# Patient Record
Sex: Female | Born: 1950 | Race: White | Hispanic: No | Marital: Married | Smoking: Never smoker
Health system: Southern US, Community
[De-identification: ages and names within clinical notes are randomized; demographics above are authoritative.]

## PROBLEM LIST (undated history)

## (undated) DIAGNOSIS — Z8041 Family history of malignant neoplasm of ovary: Secondary | ICD-10-CM

## (undated) DIAGNOSIS — G4700 Insomnia, unspecified: Secondary | ICD-10-CM

## (undated) DIAGNOSIS — U071 COVID-19: Secondary | ICD-10-CM

## (undated) DIAGNOSIS — I10 Essential (primary) hypertension: Secondary | ICD-10-CM

## (undated) DIAGNOSIS — C73 Malignant neoplasm of thyroid gland: Secondary | ICD-10-CM

## (undated) DIAGNOSIS — K635 Polyp of colon: Secondary | ICD-10-CM

## (undated) DIAGNOSIS — Q859 Phakomatosis, unspecified: Secondary | ICD-10-CM

## (undated) DIAGNOSIS — I82409 Acute embolism and thrombosis of unspecified deep veins of unspecified lower extremity: Secondary | ICD-10-CM

## (undated) DIAGNOSIS — Z800 Family history of malignant neoplasm of digestive organs: Secondary | ICD-10-CM

## (undated) DIAGNOSIS — D219 Benign neoplasm of connective and other soft tissue, unspecified: Secondary | ICD-10-CM

## (undated) HISTORY — DX: Benign neoplasm of connective and other soft tissue, unspecified: D21.9

## (undated) HISTORY — DX: COVID-19: U07.1

## (undated) HISTORY — DX: Essential (primary) hypertension: I10

## (undated) HISTORY — PX: OTHER SURGICAL HISTORY: SHX169

## (undated) HISTORY — DX: Polyp of colon: K63.5

## (undated) HISTORY — DX: Malignant neoplasm of thyroid gland: C73

## (undated) HISTORY — PX: POLYPECTOMY: SHX149

## (undated) HISTORY — DX: Insomnia, unspecified: G47.00

## (undated) HISTORY — DX: Family history of malignant neoplasm of ovary: Z80.41

## (undated) HISTORY — DX: Family history of malignant neoplasm of digestive organs: Z80.0

## (undated) HISTORY — DX: Phakomatosis, unspecified: Q85.9

## (undated) HISTORY — DX: Acute embolism and thrombosis of unspecified deep veins of unspecified lower extremity: I82.409

---

## 1998-09-14 ENCOUNTER — Other Ambulatory Visit: Admission: RE | Admit: 1998-09-14 | Discharge: 1998-09-14 | Payer: Self-pay | Admitting: *Deleted

## 1999-09-24 ENCOUNTER — Other Ambulatory Visit: Admission: RE | Admit: 1999-09-24 | Discharge: 1999-09-24 | Payer: Self-pay | Admitting: *Deleted

## 2000-10-18 ENCOUNTER — Other Ambulatory Visit: Admission: RE | Admit: 2000-10-18 | Discharge: 2000-10-18 | Payer: Self-pay | Admitting: *Deleted

## 2001-12-19 DIAGNOSIS — I82409 Acute embolism and thrombosis of unspecified deep veins of unspecified lower extremity: Secondary | ICD-10-CM

## 2001-12-19 DIAGNOSIS — C73 Malignant neoplasm of thyroid gland: Secondary | ICD-10-CM

## 2001-12-19 HISTORY — DX: Malignant neoplasm of thyroid gland: C73

## 2001-12-19 HISTORY — PX: THYROIDECTOMY: SHX17

## 2001-12-19 HISTORY — DX: Acute embolism and thrombosis of unspecified deep veins of unspecified lower extremity: I82.409

## 2002-01-07 ENCOUNTER — Other Ambulatory Visit: Admission: RE | Admit: 2002-01-07 | Discharge: 2002-01-07 | Payer: Self-pay | Admitting: *Deleted

## 2002-01-11 ENCOUNTER — Ambulatory Visit (HOSPITAL_COMMUNITY): Admission: RE | Admit: 2002-01-11 | Discharge: 2002-01-11 | Payer: Self-pay | Admitting: Endocrinology

## 2002-01-11 ENCOUNTER — Encounter: Payer: Self-pay | Admitting: Endocrinology

## 2002-02-05 ENCOUNTER — Other Ambulatory Visit: Admission: RE | Admit: 2002-02-05 | Discharge: 2002-02-05 | Payer: Self-pay | Admitting: Endocrinology

## 2002-03-29 ENCOUNTER — Encounter: Payer: Self-pay | Admitting: Surgery

## 2002-04-08 ENCOUNTER — Encounter (INDEPENDENT_AMBULATORY_CARE_PROVIDER_SITE_OTHER): Payer: Self-pay

## 2002-04-08 ENCOUNTER — Inpatient Hospital Stay (HOSPITAL_COMMUNITY): Admission: RE | Admit: 2002-04-08 | Discharge: 2002-04-09 | Payer: Self-pay | Admitting: Surgery

## 2002-04-11 ENCOUNTER — Inpatient Hospital Stay (HOSPITAL_COMMUNITY): Admission: AD | Admit: 2002-04-11 | Discharge: 2002-04-12 | Payer: Self-pay | Admitting: Surgery

## 2002-04-15 ENCOUNTER — Ambulatory Visit (HOSPITAL_COMMUNITY): Admission: RE | Admit: 2002-04-15 | Discharge: 2002-04-15 | Payer: Self-pay

## 2002-07-01 ENCOUNTER — Encounter: Payer: Self-pay | Admitting: Endocrinology

## 2002-07-01 ENCOUNTER — Ambulatory Visit (HOSPITAL_COMMUNITY): Admission: RE | Admit: 2002-07-01 | Discharge: 2002-07-01 | Payer: Self-pay | Admitting: Endocrinology

## 2002-07-05 ENCOUNTER — Ambulatory Visit (HOSPITAL_COMMUNITY): Admission: RE | Admit: 2002-07-05 | Discharge: 2002-07-05 | Payer: Self-pay | Admitting: Endocrinology

## 2002-08-13 ENCOUNTER — Ambulatory Visit (HOSPITAL_COMMUNITY): Admission: RE | Admit: 2002-08-13 | Discharge: 2002-08-13 | Payer: Self-pay | Admitting: Endocrinology

## 2002-08-13 ENCOUNTER — Encounter: Payer: Self-pay | Admitting: Endocrinology

## 2003-01-10 ENCOUNTER — Other Ambulatory Visit: Admission: RE | Admit: 2003-01-10 | Discharge: 2003-01-10 | Payer: Self-pay | Admitting: *Deleted

## 2004-02-12 ENCOUNTER — Other Ambulatory Visit: Admission: RE | Admit: 2004-02-12 | Discharge: 2004-02-12 | Payer: Self-pay | Admitting: *Deleted

## 2005-02-14 ENCOUNTER — Other Ambulatory Visit: Admission: RE | Admit: 2005-02-14 | Discharge: 2005-02-14 | Payer: Self-pay | Admitting: *Deleted

## 2005-04-29 ENCOUNTER — Encounter (INDEPENDENT_AMBULATORY_CARE_PROVIDER_SITE_OTHER): Payer: Self-pay | Admitting: Specialist

## 2005-04-29 ENCOUNTER — Ambulatory Visit (HOSPITAL_COMMUNITY): Admission: RE | Admit: 2005-04-29 | Discharge: 2005-04-29 | Payer: Self-pay | Admitting: Obstetrics and Gynecology

## 2005-04-29 ENCOUNTER — Ambulatory Visit (HOSPITAL_BASED_OUTPATIENT_CLINIC_OR_DEPARTMENT_OTHER): Admission: RE | Admit: 2005-04-29 | Discharge: 2005-04-29 | Payer: Self-pay | Admitting: Obstetrics and Gynecology

## 2006-02-21 ENCOUNTER — Other Ambulatory Visit: Admission: RE | Admit: 2006-02-21 | Discharge: 2006-02-21 | Payer: Self-pay | Admitting: Obstetrics & Gynecology

## 2006-03-23 ENCOUNTER — Ambulatory Visit: Payer: Self-pay | Admitting: Internal Medicine

## 2006-05-11 ENCOUNTER — Encounter (INDEPENDENT_AMBULATORY_CARE_PROVIDER_SITE_OTHER): Payer: Self-pay | Admitting: Specialist

## 2006-05-11 ENCOUNTER — Ambulatory Visit: Payer: Self-pay | Admitting: Internal Medicine

## 2007-05-11 ENCOUNTER — Other Ambulatory Visit: Admission: RE | Admit: 2007-05-11 | Discharge: 2007-05-11 | Payer: Self-pay | Admitting: Obstetrics & Gynecology

## 2007-07-23 ENCOUNTER — Encounter: Payer: Self-pay | Admitting: Obstetrics & Gynecology

## 2007-07-23 ENCOUNTER — Inpatient Hospital Stay (HOSPITAL_COMMUNITY): Admission: RE | Admit: 2007-07-23 | Discharge: 2007-07-25 | Payer: Self-pay | Admitting: *Deleted

## 2007-09-06 ENCOUNTER — Ambulatory Visit: Payer: Self-pay | Admitting: Internal Medicine

## 2007-10-11 ENCOUNTER — Ambulatory Visit: Payer: Self-pay | Admitting: Internal Medicine

## 2007-12-20 HISTORY — PX: ABDOMINAL HYSTERECTOMY: SHX81

## 2008-01-09 DIAGNOSIS — I2699 Other pulmonary embolism without acute cor pulmonale: Secondary | ICD-10-CM | POA: Insufficient documentation

## 2008-01-09 DIAGNOSIS — I82409 Acute embolism and thrombosis of unspecified deep veins of unspecified lower extremity: Secondary | ICD-10-CM | POA: Insufficient documentation

## 2008-01-09 DIAGNOSIS — C73 Malignant neoplasm of thyroid gland: Secondary | ICD-10-CM | POA: Insufficient documentation

## 2008-01-10 ENCOUNTER — Ambulatory Visit: Payer: Self-pay | Admitting: Internal Medicine

## 2008-01-10 DIAGNOSIS — R599 Enlarged lymph nodes, unspecified: Secondary | ICD-10-CM | POA: Insufficient documentation

## 2009-05-21 ENCOUNTER — Ambulatory Visit: Payer: Self-pay | Admitting: Internal Medicine

## 2011-05-03 NOTE — Discharge Summary (Signed)
April Floyd, PASION NO.:  [id number]   MEDICAL RECORD NO.:  [id number]          PATIENT TYPE:  INP   LOCATION:  9317                          FACILITY:  [facility]   PHYSICIAN:  M. Leda Quail, MD  DATE OF BIRTH:  1951/12/06   DATE OF ADMISSION:  07/23/2007  DATE OF DISCHARGE:  07/25/2007                               DISCHARGE SUMMARY   ADMISSION DIAGNOSES:  1. Patient is a 60 year old G2 P2 married white female with fibroid      uterus.  2. Menorrhagia.  3. History of anemia.  4. History of thyroid cancer.  5. History of deep vein thrombosis.   POSTOPERATIVE DIAGNOSES:  1. Patient is a 60 year old G2 P2 married white female with fibroid      uterus.  2. Menorrhagia.  3. History of anemia.  4. History of thyroid cancer.  5. History of deep vein thrombosis.  6. Endometriosis.   PROCEDURE:  Total abdominal hysterectomy.   HISTORY OF PRESENT ILLNESS:  In brief, Ms. Stansbery is a very nice 55-year-  old G2 P2 married white female who has a history of fibroid uterus with  symptomatic menorrhagia.  She has had anemia, but we have been able to  improve that with progesterone therapy.  She has unfortunately continued  to have heavy bleeding, and she does have a 14-16 week sized uterus on  physical exam.  She has decided to definitive therapy.  Because of her  history of DVT, I have discussed treatment with Lovenox for prevention  of this.   PAST MEDICAL HISTORY:  Thyroid cancer, history of DVT, breast hematoma,  colonic polyps.   MEDICATIONS:  Synthroid, baby aspirin, norethindrone.   FAMILY HISTORY:  Sister with ovarian cancer.   PHYSICAL EXAM:  Temperature 93.9, pulse 60, respirations 18, BP 99/68.  The only pertinent positives is that she does have a thyroidectomy scar,  and on GYN exam, she has an enlarged 14-16 week sized uterus.   HOSPITAL COURSE:  The patient was admitted to same-day surgery.  She was  taken to the operating room where a TAH was  performed.  She did have a  grossly enlarged uterus that completely filled the pelvis with multiple  fibroids.  The patient did well with surgery.  She had a approximately  150 cc of blood loss and about 150 cc urine output.  After her surgery,  she was taken to the recovery room.  After an appropriate time in the  recovery room, she was taken to the third floor for the remainder of her  hospitalization.   In the evening of postoperative day #1, she was seen.  She was doing  well.  She was having some mild nausea but did have good pain control.  Vital signs were stable, and she had made good urine output.  The  abdomen had rare bowel sounds but was appropriately tender and  nondistended.   On postoperative day #1, she was feeling okay.  She felt like her PCA  was given her nausea.  She was afebrile with stable vital signs.  Again  she had made good urine output.  Physical exam was benign.  She had  excellent bowel sounds.  Postop hemoglobin was 10.9.  White count was  7.2.  Electrolytes were normal.  BUN and creatinine were 6 and 0.7.  Her  Foley was discontinued.  She was advanced to a regular diet.  She was  encouraged to ambulate.  She was switched to oral pain medicines.  The  patient did have some issues with pain management this day; was  ultimately controlled with Percocet.   However, in the evening of postoperative day #1, she began to have some  right-sided shoulder pain.  This worsened in the evening, and at about  10:30, I was called by on-floor nursing.  At this point, she was quite  anxious with it, and she was having some pain with deep inspiration.  An  EKG, stat CBC, troponin-1 and BNP were obtained.  Her hemoglobin was  11.9.  A troponin was normal.  BMP was 125.  She was seen and evaluated.  Although her vital signs were stable, with her history, it decided to go  ahead and obtain a chest CT.  This did not show any evidence of  pulmonary emboli or dissecting aortic  aneurysm, but there was some mild  mediastinal adenopathy and some question about finding of sarcoidosis.  This was relayed to the patient but can be followed post-operatively.  After the CT was negative, she was treated again with pain medication  which actually improved her pain and completely resolved the shoulder  pain.  Her incentive spirometer was encouraged; regular pain medication  was encouraged.   On the morning of postoperative day #2, she slept well, she was feeling  better, she had passed flatus, she was afebrile with stable vital signs.  Again pulse ox was 98-100% on room air.  She had a completely benign  exam.  Because of the events of the evening, I have decided to watch her  through the day.   She was seen in the evening of postoperative day #2.  She had done well  during the day.  The shoulder pain was essentially gone.  She was having  no breathing issues.  At this point, her On-Q pump tubing was removed,  and discharge instruments were provided in written and verbal form for  the patient.  I did feel discharge to home was appropriate, and she will  be discharged with her husband and friend.   DISCHARGE MEDICATIONS:  She will be discharged with:  1. Percocet 05/325 mg 1-2 tablets p.o. q.4-6 hours p.r.n. pain.  2. Motrin 400-800 mg every 8 hours as needed for pain.  3. Lovenox 40 mg subcu daily, and she will continue this for 4 weeks.  4. She is also to start Colace 100 mg b.i.d. for constipation      prevention.   PATHOLOGY REPORT:  Shows a 15 x 13 x 10 cm uterus with a 4-cm cervix  that weighs 176 grams and contained endometrial polyp, weakly  proliferative endometrium, submucous and intramural leiomyomas.      Lum Keas, MD  Electronically Signed     MSM/MEDQ  D:  07/25/2007  T:  07/26/2007  Job:  [id number]

## 2011-05-03 NOTE — Op Note (Signed)
April Floyd, April [name]:  [id number]   MEDICAL RECORD NO.:  [id number]          PATIENT TYPE:  AMB   LOCATION:  [facility]C                           FACILITY:  [facility]   PHYSICIAN:  M. Leda Quail, MD  DATE OF BIRTH:  [birth year]   DATE OF PROCEDURE:  DATE OF DISCHARGE:                               OPERATIVE REPORT   PREOPERATIVE DIAGNOSES:  64. A 60 year old, G2, P2, married ,white female with a fibroid uterus.  2. Menorrhagia.  3. History of anemia.  4. History of thyroid cancer.  5. History of the DVT.   POSTOPERATIVE DIAGNOSES:  69. A 60 year old, G2, P2, married ,white female with a fibroid uterus.  2. Menorrhagia.  3. History of anemia.  4. History of thyroid cancer.  5. History of the DVT.  6. Endometriosis.   PROCEDURE:  TAH.   SURGEON:  Dr. Leda Quail.   ASSISTANT:  Dr. Edwena Felty. Romine.   ANESTHESIA:  General endotracheal.   FINDINGS:  Large 14-16-week uterus with some endometriosis in the  posterior cul-de-sac.   SPECIMENS:  Uterus and cervix sent to pathology.   ESTIMATED BLOOD LOSS:  125.   URINE OUTPUT:  150 mL of clear urine output.   FLUIDS:  1800 mL of LR.   COMPLICATIONS:  None.   INDICATIONS:  April Floyd is a very nice 60 year old, G2, P2, married,  white female who has an enlarged uterus I had been following  conservatively.  This year when I saw her for exam, she had a hemoglobin  of 10.  She has been treated with Micronor but was failing Micronor  therapy.  She was put on higher dose continuous progestin therapy to  improve her bleeding.  The quantity of flow did improve and her  hemoglobin is ultimately improved but she has continued to have  irregular bleeding.  She also has a palpable uterus on physical exam  just with palpation of the abdomen due to the size of this uterus.  She  is in very good shape and has begun to have significant more amount of  pelvic pressure and bladder pressure.  She has decided at this point  to  proceed with definitive therapy with surgical intervention.  I did talk  to Dr. Stanford Breed at [facility] due to her history of DVT and we  decided to treat her not only with sequential compression devices but  subcutaneous Lovenox for 4 weeks postop.   PROCEDURE:  The patient was taken the to operating room.  She was placed  in supine position.  General endotracheal anesthesia was administered by  the anesthesia staff without difficulty. The legs were positioned in the  frog-leg position.  Abdomen, perineum, inner thighs and vagina were  prepped in normal sterile fashion, a Foley catheter was inserted in the  bladder under sterile conditions.  The legs were then straightened with  support under the knees and the patient is draped in the normal sterile  fashion.  The Pfannenstiel skin incision was made through her old C-  section incision.  The subcutaneous fat tissue was  dissected sharply.  Cautery is obtained with the Bovie as necessary.  The fascia was  identified and nicked in the midline, the fascial incision is extended  laterally using curved Mayo scissors.   Kocher clamps were applied to the inferior aspect of the fascial  incision.  The underlying rectus muscles were dissected off sharply.  In  a similar fashion, Kocher clamps were applied to the inferior aspect of  the fascial incision and the underlying rectus muscle was dissected off  sharply.  The rectus muscle was divided in the midline.  The peritoneum  was identified, elevated, and entered sharply. The peritoneal incision  was extended superiorly and then inferiorly with care taken to note the  location of the bladder.   The abdomen surveyed, the liver edge was smooth.  The gallbladder was  decompressed, the aorta was palpable. There is no pelvic mass or  lymphadenopathy. The diaphragm is smooth and the kidneys are palpable. A  Balfour retractor was then placed intraabdominally.  Care was taken to  ensure no  bowel is in the way.  Because the patient is very thin, green  towels were placed underneath the retractor to lift it up off the psoas  muscles to ensure no nerve injury occurred.  The bowel was packed with  three moistened laparotomy sponges superiorly.  A bladder blade was  inserted inferiorly.   Long Kelly clamps were placed along the uterine ovarian ligament  bilaterally.  Attention is turned initially to the left side. The ureter  was identified.  The round ligament suture ligated.  The posterior leaf  of the broad ligament was opened parallel to the IP ligament and then  the inferior leaf of the broad ligament is opened.  A peritoneal  incision is brought to the level of the internal os at the midline.  The  utero-ovarian pedicle on the left side is isolated and clamped with a  Heaney clamp.  It is transected and suture ligated first with a free tie  of #0 Vicryl and then with a stitch tie.   In a similar fashion, the right round ligament is identified and suture  ligated.  The posterior leaf of the broad ligament is opened parallel to  the IP ligament.  The utero-ovarian pedicle on the right side is  isolated and clamped with a Heaney clamp.  The pedicle is transected and  suture ligated with a free tie of #0 Vicryl and the second with a stitch  tie.  The peritoneum is then incised from the inferior leaf of the broad  ligament down again to the level of the internal os of the cervix and  connected to the previous incision done on the left side.  Then the  beginning of the creation of the bladder flap is begun by dissecting the  filmy tissue.  There was some scar where the 2 previous C sections were  and this was taken down sharply.   At this point, the uterine arteries were skeletonized bilaterally and  clamped with curved Heaney clamps.  Back clamping is performed.  The  pedicles were transected and suture ligated with two #0 Vicryl stitch  ties.  This was done bilaterally.   Further dissection of the bladder  flap was performed as necessary down the cervix.  The glistening white  tissue of the cervix was well visualized to ensure that we were in the  right plane.  At the beginning of the cardinal ligament, transection was  performed by  clamping close to the cervix with straight Heaney clamps.  These pedicles were then transected and suture ligated with #0 Vicryl.  At this point, the fundus of the uterus was amputated with a knife.  Kocher clamps were placed across the top of the cervix.  The remainder  of the cardinal ligaments are then serially clamped, transected and  suture ligated with #0 Vicryl.  Further dissection of the bladder was  performed as necessary.  There is a small amount of endometriosis in the  posterior cul-de-sac and tissue of the rectum is close to the base of  the cervix.  The peritoneum at this point is opened at the base of the  cervix and the tissue is pushed down to ensure that the rectum is not in  the surgical field.  At this point curved Heaney clamps were placed  along the base of the cervix.  The pedicles were transected and Heaney  suture with #0 Vicryl.  Jorgenson scissors were then used to amputate  the cervix off of the vaginal mucosa by cutting very close to the edge  of the cervix and leaving as much vaginal mucosa as possible.  At this  point, the cervix was handed off and photo documentation of the cervix  and uterus were made.  The vagina was then closed with figure-of-eight  sutures of #0 Vicryl. Before the vagina is closed, the angle of the  vagina is attached to ipsilateral uterosacral ligament to ensure that  the vagina is well supported.  The vagina is closed with figure-of-eight  sutures of #0 Vicryl.   The pelvis was irrigated with copious amounts of warm normal saline.  There was a small amount of bleeding at the vaginal cuff which is made  hemostatic with an additional figure-of-eight suture of #0 Vicryl.   Then  the area where the peritoneal dissection was performed also had a little  bit of bleeding which was made hemostatic with a figure-of-eight suture  of 2-0 Vicryl.  A small amount of bleeding along the bladder flap which  was made hemostatic with cautery. Gelfoam was placed in the bladder flap  to ensure there is excellent hemostasis.  The pelvis is completely  surveyed.  No additional bleeding is noted.  The round ligaments were  tied to the ipsilateral ovary to keep the ovary out of the pelvis.  The  ureters were identified bilaterally with great peristalsis.   At this point, the procedure was ended, the Balfour retractor was  removed from the abdomen and the bladder blade was removed and three-  point laparotomy sponges are removed.  The bowel was placed back in  normal anatomic position and the omentum is placed in normal anatomic  position.  The peritoneum was closed with a running #0 Vicryl stitch.  The subfascial tissue and rectus muscles were visualized. There was a  small amount of bleeding which was made hemostatic with cautery.  An On-  Q pump tubing was placed subfascially on the right side using the  introducer.  The tubing is attached to the skin with Steri-Strips and a  Tegaderm.  The fascia is then closed starting at the corners running to  the midline with a #0 Vicryl.  The subcutaneous fat tissue was irrigate,  no bleeding was noted.  A second On-Q pump tubing is placed on the  patient's left side with the introducer subcutaneously.  Again the  tubing is attached to the skin with Steri-Strips and Tegaderm.  The skin  is then closed with a subcuticular stitch of 3-0 Vicryl.  Benzoin and  Steri-Strips were applied.  5 mL of 1% Xylocaine are placed  subcutaneously and 10 mL were placed subfascially.  At this point, the  procedure is ended, sponge, lap, needle, and instrument counts are  correct x2.  The patient tolerated the procedure well.  She is awakened  from  anesthesia  and extubated without difficulty.  She is taken to the  recovery room in stable condition.      Lum Keas, MD  Electronically Signed     MSM/MEDQ  D:  07/23/2007  T:  07/23/2007  Job:  [id number]

## 2011-05-03 NOTE — Assessment & Plan Note (Signed)
[facility]                             PULMONARY OFFICE NOTE   April Floyd, April Floyd                        MRN:          [id number]  DATE:09/06/2007                            DOB:          01/23/1951    PROBLEM:  This is a 60 year old woman referred in pulmonary consultation  at the kind request of Dr. Hyacinth Meeker because of an abnormal CT scan.   HISTORY:  She had a hysterectomy and had postoperative shoulder pains  leading to a CT scan of the chest. This showed a tiny effusion and  atelectasis with mild mediastinal and hilar adenopathy, and some  interstitial prominence. Sarcoid was questioned. She has had no history  of exposure to birds or to sustained time in parts of the [place] where  granulomatous infections are common. Her shoulder discomfort has  resolved. She has not experienced exertional dyspnea, cough, wheeze,  night sweats, rash, arthralgias, or adenopathy, and she has no history  of liver disease.   MEDICATIONS:  1. Synthroid 135 mcg.  2. Multivitamins.  3. Aspirin 81 mg.   ALLERGIES:  No medication allergy.   REVIEW OF SYSTEMS:  Headaches, but otherwise essentially negative except  as per HPI.   PAST HISTORY:  Remote deep vein thrombosis at the time of surgery with  pulmonary embolism. She took Lovenox, then Coumadin, and was told that  she did not have a coagulopathy. She had thyroidectomy for thyroid  cancer treated with radioactive iodine. An episode of transient global  amnesia with multiple medical evaluations at that time including a CT  scan of the chest. She was not told of any problem. Surgeries for  thyroidectomy and hysterectomy. No exposure to tuberculosis.   SOCIAL HISTORY:  Never smoked, rare social alcohol. She is married with  two children. She works as an Armed forces training and education officer.   FAMILY HISTORY:  Sister with ovarian cancer, grandfather with brain  cancer.   OBJECTIVE:  VITAL  SIGNS:  Weight 135 pounds, blood pressure 110/68,  pulse 57, room air saturation 100%.  GENERAL:  Tall, trim, wiry, fit appearing woman. She seems comfortable.  No adenopathy found.  HEENT:  Nose and throat clear.  NECK:  No neck vein distension, thyroidectomy scar.  CHEST:  Clear. Breathing unlabored.  HEART:  Sounds are regular without murmur or gallop.  ABDOMEN:  No enlargement of the liver or spleen.  EXTREMITIES:  Without cyanosis, clubbing, or edema.   Lab work includes an ACE level normal at 17 (9 to 67), sedimentation  rate was normal at 8. These were drawn on 08/09/2007.   IMPRESSION:  Non-specific changes may reflect old granulomatous disease  or burned out sarcoid. I doubt that therapy will be required.   PLAN:  We are scheduling pulmonary function tests and she will return  then in four months for follow up. We will likely get a chest x-ray on  that return and repeat an ACE level looking for evidence of activity. I  appreciate the chance to meet her.     Clinton D. Maple Hudson, MD,  FCCP, FACP  Electronically Signed    CDY/MedQ  DD: 09/15/2007  DT: 09/16/2007  Job #: [id number]   cc:   M. Leda Quail, MD

## 2011-05-06 NOTE — Op Note (Signed)
[name], [name]:  [id number]   MEDICAL RECORD NO.:  [id number]          PATIENT TYPE:  AMB   LOCATION:  [facility]C                         FACILITY:  [facility]   PHYSICIAN:  Cynthia P. Romine, M.D.DATE OF BIRTH:  March 04, 1951   DATE OF PROCEDURE:  04/29/2005  DATE OF DISCHARGE:                                 OPERATIVE REPORT   PREOPERATIVE DIAGNOSIS:  Menorrhagia with known uterine fibroids and large  endometrial polyp.   POSTOPERATIVE DIAGNOSIS:  Menorrhagia with known uterine fibroids and large  endometrial polyp.  Path pending.   PROCEDURE:  Hysteroscopic resection of large endometrial polyp, D&C.   SURGEON:  Dr. Arline Asp Romine   ANESTHESIA:  General by LMA.   ESTIMATED BLOOD LOSS:  50 mL.   SORBITOL DEFICIT:  130 mL.   COMPLICATIONS:  None.   DESCRIPTION OF PROCEDURE:  The patient was taken to the operating room and  after the induction of adequate general anesthesia, was placed in the  dorsolithotomy position and prepped and draped in the usual fashion.  The  bladder was drained with a red rubber catheter.  A posterior weighted and  anterior Simms retractor were placed, and a tenaculum was placed on the  anterior lip of the cervix.  The cervix was dilated to #31 Shawnie Pons.  The  operative hysteroscope was introduced; sorbitol was used as a distension  medium.  The pressure was set of 70 mmHg.  A large endometrial polyp almost  filling the cavity was noted, and photographic documentation was taken.  The  polyp was resected in pieces. It was quite a tedious resection because the  polyp was so large.  It required multiple times of removal of the scope from  the sleeve to take out fragments of the polyp.  Polyp forceps were  introduced after taking out the sleeve, and an attempt was made to remove  some of the polyp with polyp forceps which was successful.  Curettage was  done, and more fragments of the polyp were taken out with curettage.  The  scope was  then reintroduced, and then any remaining fragments of the polyp  were removed with the single loop.  Photographic documentation was taken  after the procedure.  The instruments were removed from the uterus.  The  patient was observed for approximately 10 minutes to make sure there was not  any excessive bleeding from the uterus into the vagina, and there was not.  Instruments were removed from the vagina, and the procedure was terminated.  The patient tolerated it well and went in satisfactory condition to  postanesthesia recovery.     CPR/MEDQ  D:  04/29/2005  T:  04/29/2005  Job:  [id number]

## 2011-05-06 NOTE — Consult Note (Signed)
[place]. [facility]  Patient:    April Floyd, April Floyd Visit Number: [id number] MRN: [id number]          Service Type: SUR Location: [id number] Attending Physician:  Charlton Haws Dictated by:   Lowell C. Catha Gosselin, M.D. Proc. Date: 04/11/02 Admit Date:  04/11/2002   CC:         Bernadene Person, M.D.  Andres Ege, M.D.  Currie Paris, M.D.   Consultation Report  DATE OF BIRTH: 05-Aug-1951  REASON FOR CONSULTATION: The patient is a 60 year old female patient seen for possible coagulopathy.  HISTORY OF PRESENT ILLNESS: She was hospitalized 24 hours for a thyroidectomy on April 08, 2002 and returned to have Doppler studies due to right lower extremity pain.  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  Currently on oral contraceptives and has a family history of thrombus formation in a brother.  Dad had a couple of CVAs.  She is a nonsmoker.  Thyroid nodule was found during a regular examination.  PAST MEDICAL HISTORY:  1. Positive for transient global amnesia.  2. Toxemia during pregnancy.  PAST SURGICAL HISTORY:  1. Cesarean section x2 18 and 18 years ago.  2. Tonsillectomy at age [age].  ALLERGIES: None.  CURRENT MEDICATIONS:  1. Birth control pills.  2. Synthroid.  3. Lovenox.  4. Coumadin.  FAMILY HISTORY: As above.  SOCIAL HISTORY: [place]  Married x21 years. Teacher of young life religion classes.  Very active.  Yearly Pap smear and mammograms have been normal.  REVIEW OF SYSTEMS: She has had some menstrual headaches.  No pleuritic pain. No shortness of breath.  PHYSICAL EXAMINATION:  VITAL SIGNS: Temperature 96.8 degrees, pulse 56, respirations 16, blood pressure 122/53.  O2 saturation 96% on room air.  Height 66 inches.  Weight 140 pounds.  HEENT: Normocephalic.  PERRL.  EOMI.  Sclerae anicteric.  NECK: Steri-Strips are in place and tender to  touch.  NODES: No cervical, supraclavicular, axillary, or inguinal adenopathy.  CHEST: Clear to auscultation bilaterally.  HEART: Bradycardic, regular rate and rhythm.  ABDOMEN: Soft, nontender.  EXTREMITIES: Right lower extremity calf tenderness.  No appreciable edema.  NEUROLOGIC: Normal.  LABORATORY DATA: PT 13.7 seconds, INR 1.1, PTT 38 seconds.  WBC 6, hemoglobin 12.2, platelet count 191,000.  ASSESSMENT: This is a 60 year old female with a small deep vein thrombosis around the time of thyroidectomy, on hormone therapy.  I suspect inactivity and birth control pills put her at risk for deep vein thrombosis.  Forming of clots probably less likely.  RECOMMENDATIONS:  1. Will send a factor V Leiden off and bring her in to discuss the results in     two to three weeks.  2. With her on Coumadin and heparin we really cannot check an AT III level     right now.  3. She is going to need anticoagulation likely for around three months     regardless of our results with an INR of 2-2.5.  Again, I told her we     would bring her when some of her studies start to come back.  4. I would go ahead and discontinue her birth control pills.  Thank you for allowing [place] to share in her care.Dictated by:   Lowell C. Catha Gosselin, M.D. Attending Physician:  Charlton Haws DD:  04/11/02 TD:  04/12/02 Job: [id number]

## 2011-05-06 NOTE — Op Note (Signed)
[facility]  Patient:    April Floyd, April Floyd Visit Number: [id number] MRN: [id number]          Service Type: SUR Location: [id number] 01 Attending Physician:  Charlton Haws Dictated by:   Currie Paris, M.D. Proc. Date: 04/08/02 Admit Date:  04/08/2002   CC:         Andres Ege, M.D.  Bernadene Person, M.D.   Operative Report  VISIT NUMBER:  [id number]  [id number]  PREOPERATIVE DIAGNOSIS:  Mass, right lobe of thyroid, possible papillary carcinoma.  POSTOPERATIVE DIAGNOSIS:  Papillary carcinoma right lobe of thyroid.  OPERATION:  Total thyroidectomy.  SURGEON:  Currie Paris, M.D.  ASSISTANT:  Ollen Gross. Vernell Morgans, M.D.  ANESTHESIA:  General endotracheal.  CLINICAL HISTORY:  This patient is a 60 year old, recently found to have a small right lobe mass which FNA showed likely to be papillary but no diagnostic.  After discussion of alternatives, we elected to proceed to right thyroid lobectomy with possible total thyroidectomy if this were malignant.  DESCRIPTION OF PROCEDURE:  The patient was seen in the holding area and had no further questions.  She was taken to the operating room and after satisfactory general anesthesia had been obtained, the neck was prepped and draped.  A transverse incision was made about two fingerbreadths above the collar bone and above the clavicle and subcutaneous tissues and platysma divided. Subplatysmal flaps were raised and the self-retaining retractor placed.  The muscles were open, split in line with the midline, and the right lobe exposed. There was a hard nodule in the right lobe, measuring about 1 cm.  I was able to mobilize the right lobe a little bit, freed up a little bit of the lower pole in the midline and then, with some retraction, was able to identify the superior pole vessels.  I dissected those out cleanly and ligated them with 2-0 silk.  This gave a little bit more mobility, and  the thyroid continued to rotate medially.  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  As I got better exposure, I was able to identify the recurrent laryngeal nerve, trace it up, and find its insertion and then avoid it completely after that.  The remaining vessels were divided either using clips, cautery, or 4-0 silk ties until I had the thyroid rotated past the nerve and onto the trachea.  I was able to get behind the thyroid along the avascular plane and then free up a little bit more of the superior pole medially around the isthmus.  Clamps were placed on the thyroid and the thyroid divided and sent for frozen section, thus completing removal of the thyroid and isthmus.  While waiting, I temporarily closed after making sure everything was dry but left the skin open.  Unfortunately, pathology did report that this was papillary carcinoma.  The sutures were removed and the self-retaining replaced.  The left lobe was removed in a similar fashion to the right, again taking a couple of medial vessels from the inferior pole then taking down the superior pole, rotating the thyroid medially, identifying the nerve, and staying up on the thyroid with small vessels until we had this completely cleared off.  The remaining medial attachments were divided and then what appeared to be a little bit of a pyramidal lobe dissected out and divided, again using silk ties  or small clips as indicated.  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  The wound was closed after irrigating using 4-0 Vicryl to close the midline, 4-0 Vicryl on the platysma and some staples and Steri-Strips on the skin.  The patient tolerated the procedure well.  There were no  operative complications.  All counts were correct. Dictated by:   Currie Paris, M.D. Attending Physician:  Charlton Haws DD:  04/08/02 TD:  04/08/02 Job: [id number]

## 2011-05-10 ENCOUNTER — Encounter: Payer: Self-pay | Admitting: Internal Medicine

## 2011-10-03 LAB — URINALYSIS, ROUTINE W REFLEX MICROSCOPIC
Bilirubin Urine: NEGATIVE
Glucose, UA: NEGATIVE
Hgb urine dipstick: NEGATIVE
Protein, ur: NEGATIVE
Urobilinogen, UA: 0.2

## 2011-10-03 LAB — CBC
Hemoglobin: 10.9 — ABNORMAL LOW
MCHC: 33.1
MCHC: 33.9
MCV: 82.3
MCV: 83.7
Platelets: 215
Platelets: 240
RBC: 4.25
RDW: 14.9 — ABNORMAL HIGH
RDW: 15 — ABNORMAL HIGH
RDW: 14.9 — ABNORMAL HIGH
WBC: 4.9

## 2011-10-03 LAB — PROTIME-INR
INR: 1
Prothrombin Time: 13.5

## 2011-10-03 LAB — BASIC METABOLIC PANEL
BUN: 8
CO2: 28
Calcium: 8.1 — ABNORMAL LOW
Calcium: 9.3
Chloride: 104
Creatinine, Ser: 0.81
GFR calc non Af Amer: >60
Glucose, Bld: 127 — ABNORMAL HIGH
Glucose, Bld: 93
Potassium: 4.1
Sodium: 135

## 2011-10-03 LAB — TSH: TSH: 0.988

## 2011-10-03 LAB — APTT: aPTT: 32

## 2011-10-03 LAB — B-NATRIURETIC PEPTIDE (CONVERTED LAB): Pro B Natriuretic peptide (BNP): 125 — ABNORMAL HIGH

## 2011-10-03 LAB — TROPONIN I: Troponin I: 0.05

## 2012-09-13 ENCOUNTER — Encounter: Payer: Self-pay | Admitting: Internal Medicine

## 2012-09-21 ENCOUNTER — Other Ambulatory Visit (HOSPITAL_COMMUNITY): Payer: Self-pay | Admitting: Endocrinology

## 2012-09-21 DIAGNOSIS — C73 Malignant neoplasm of thyroid gland: Secondary | ICD-10-CM

## 2012-10-08 ENCOUNTER — Encounter (HOSPITAL_COMMUNITY)
Admission: RE | Admit: 2012-10-08 | Discharge: 2012-10-08 | Disposition: A | Discharge status: Home / Self Care | Payer: Commercial Managed Care - PPO | Source: Ambulatory Visit | Attending: Endocrinology | Admitting: Endocrinology

## 2012-10-08 ENCOUNTER — Encounter: Payer: Self-pay | Admitting: Internal Medicine

## 2012-10-08 DIAGNOSIS — C73 Malignant neoplasm of thyroid gland: Secondary | ICD-10-CM | POA: Insufficient documentation

## 2012-10-08 MED ORDER — THYROTROPIN ALFA 1.1 MG IM SOLR
0.9000 mg | INTRAMUSCULAR | Status: AC
  Administered 2012-10-08: 0.9 mg via INTRAMUSCULAR
  Filled 2012-10-08: qty 0.9

## 2012-10-09 ENCOUNTER — Encounter (HOSPITAL_COMMUNITY)
Admission: RE | Admit: 2012-10-09 | Discharge: 2012-10-09 | Disposition: A | Discharge status: Home / Self Care | Payer: Commercial Managed Care - PPO | Source: Ambulatory Visit | Attending: Endocrinology | Admitting: Endocrinology

## 2012-10-09 DIAGNOSIS — C73 Malignant neoplasm of thyroid gland: Secondary | ICD-10-CM | POA: Insufficient documentation

## 2012-10-09 MED ORDER — THYROTROPIN ALFA 1.1 MG IM SOLR
0.9000 mg | INTRAMUSCULAR | Status: AC
  Administered 2012-10-09: 0.9 mg via INTRAMUSCULAR
  Filled 2012-10-09: qty 0.9

## 2012-10-10 ENCOUNTER — Encounter (HOSPITAL_COMMUNITY)
Admission: RE | Admit: 2012-10-10 | Discharge: 2012-10-10 | Disposition: A | Discharge status: Home / Self Care | Payer: Commercial Managed Care - PPO | Source: Ambulatory Visit | Attending: Endocrinology | Admitting: Endocrinology

## 2012-10-10 DIAGNOSIS — C73 Malignant neoplasm of thyroid gland: Secondary | ICD-10-CM | POA: Insufficient documentation

## 2012-10-12 ENCOUNTER — Encounter (HOSPITAL_COMMUNITY)
Admission: RE | Admit: 2012-10-12 | Discharge: 2012-10-12 | Disposition: A | Discharge status: Home / Self Care | Payer: Commercial Managed Care - PPO | Source: Ambulatory Visit | Attending: Endocrinology | Admitting: Endocrinology

## 2012-10-12 DIAGNOSIS — C73 Malignant neoplasm of thyroid gland: Secondary | ICD-10-CM | POA: Insufficient documentation

## 2012-10-12 MED ORDER — SODIUM IODIDE I 131 CAPSULE: 4.0000 | Freq: Once | INTRAVENOUS | Status: DC | PRN

## 2012-10-12 MED ORDER — SODIUM IODIDE I 131 CAPSULE
4.0000 | Freq: Once | INTRAVENOUS | Status: AC | PRN
  Administered 2012-10-12: 4 via ORAL

## 2012-10-15 LAB — THYROGLOBULIN ANTIBODY: Thyroglobulin Ab: <20 U/mL (ref <40.0)

## 2012-10-15 LAB — THYROGLOBULIN LEVEL: Thyroglobulin: <0.2 ng/mL (ref 0.0–55.0)

## 2012-11-14 ENCOUNTER — Telehealth: Payer: Self-pay | Admitting: Internal Medicine

## 2012-11-14 NOTE — Telephone Encounter (Signed)
Spoke with patient and rescheduled patient on 11/30/12 at 7:30/ 8:30 AM

## 2012-11-21 ENCOUNTER — Ambulatory Visit (AMBULATORY_SURGERY_CENTER): Payer: Commercial Managed Care - PPO | Admitting: *Deleted

## 2012-11-21 VITALS — Ht 66.0 in | Wt 140.0 lb

## 2012-11-21 DIAGNOSIS — Z1211 Encounter for screening for malignant neoplasm of colon: Secondary | ICD-10-CM

## 2012-11-21 MED ORDER — MOVIPREP 100 G PO SOLR: ORAL | Status: DC

## 2012-11-30 ENCOUNTER — Encounter: Payer: Self-pay | Admitting: Internal Medicine

## 2012-11-30 ENCOUNTER — Ambulatory Visit (AMBULATORY_SURGERY_CENTER): Payer: Commercial Managed Care - PPO | Admitting: Internal Medicine

## 2012-11-30 VITALS — BP 116/61 | HR 48 | Temp 97.0°F | Resp 20 | Ht 66.0 in | Wt 140.0 lb

## 2012-11-30 DIAGNOSIS — D126 Benign neoplasm of colon, unspecified: Secondary | ICD-10-CM

## 2012-11-30 DIAGNOSIS — Z1211 Encounter for screening for malignant neoplasm of colon: Secondary | ICD-10-CM

## 2012-11-30 HISTORY — PX: COLONOSCOPY: SHX174

## 2012-11-30 MED ORDER — SODIUM CHLORIDE 0.9 % IV SOLN: 500.0000 mL | INTRAVENOUS | Status: DC

## 2012-11-30 NOTE — Progress Notes (Signed)
Patient did not experience any of the following events: a burn prior to discharge; a fall within the facility; wrong site/side/patient/procedure/implant event; or a hospital transfer or hospital admission upon discharge from the facility. (G8907) Patient did not have preoperative order for IV antibiotic SSI prophylaxis. (G8918)  

## 2012-11-30 NOTE — Patient Instructions (Addendum)
YOU HAD AN ENDOSCOPIC PROCEDURE TODAY AT THE [facility]: Refer to the procedure report that was given to you for any specific questions about what was found during the examination.  If the procedure report does not answer your questions, please call your gastroenterologist to clarify.  If you requested that your care partner not be given the details of your procedure findings, then the procedure report has been included in a sealed envelope for you to review at your convenience later.  YOU SHOULD EXPECT: Some feelings of bloating in the abdomen. Passage of more gas than usual.  Walking can help get rid of the air that was put into your GI tract during the procedure and reduce the bloating. If you had a lower endoscopy (such as a colonoscopy or flexible sigmoidoscopy) you may notice spotting of blood in your stool or on the toilet paper. If you underwent a bowel prep for your procedure, then you may not have a normal bowel movement for a few days.  DIET: see dilation diet instructions  ACTIVITY: Your care partner should take you home directly after the procedure.  You should plan to take it easy, moving slowly for the rest of the day.  You can resume normal activity the day after the procedure however you should NOT DRIVE or use heavy machinery for 24 hours (because of the sedation medicines used during the test).    SYMPTOMS TO REPORT IMMEDIATELY: A gastroenterologist can be reached at any hour.  During normal business hours, 8:30 AM to 5:00 PM Monday through Friday, call [phone].  After hours and on weekends, please call the GI answering service at [phone] who will take a message and have the physician on call contact you.   Following upper endoscopy (EGD)  Vomiting of blood or coffee ground material  New chest pain or pain under the shoulder blades  Painful or persistently difficult swallowing  New shortness of breath  Fever of 100F or higher  Black, tarry-looking  stools  FOLLOW UP: If any biopsies were taken you will be contacted by phone or by letter within the next 1-3 weeks.  Call your gastroenterologist if you have not heard about the biopsies in 3 weeks.  Our staff will call the home number listed on your records the next business day following your procedure to check on you and address any questions or concerns that you may have at that time regarding the information given to you following your procedure. This is a courtesy call and so if there is no answer at the home number and we have not heard from you through the emergency physician on call, we will assume that you have returned to your regular daily activities without incident.  SIGNATURES/CONFIDENTIALITY: You and/or your care partner have signed paperwork which will be entered into your electronic medical record.  These signatures attest to the fact that that the information above on your After Visit Summary has been reviewed and is understood.  Full responsibility of the confidentiality of this discharge information lies with you and/or your care-partner.   Dilation diet- instructions  Heartburn-handout given  Stricture dilation-handout given

## 2012-11-30 NOTE — Op Note (Signed)
 [facility] [address] [place], [address]   COLONOSCOPY PROCEDURE REPORT  PATIENT: April, Floyd  MR#: [id number] BIRTHDATE: 16-Jul-1951 , 61  yrs. old GENDER: Female ENDOSCOPIST: Hart Carwin, MD REFERRED BY:  Elias Else, M.D. PROCEDURE DATE:  11/30/2012 PROCEDURE:   Colonoscopy with cold biopsy polypectomy and Colonoscopy with snare polypectomy ASA CLASS:   Class I INDICATIONS:Patient's personal history of adenomatous colon polyps and adenom.  polyp 2007. MEDICATIONS: MAC sedation, administered by CRNA and propofol (Diprivan) 250mg  IV  DESCRIPTION OF PROCEDURE:   After the risks and benefits and of the procedure were explained, informed consent was obtained.  A digital rectal exam revealed no abnormalities of the rectum.    The LB [id number]-[id number]  endoscope was introduced through the anus and advanced to the cecum, which was identified by both the appendix and ileocecal valve .  The quality of the prep was good, using MoviPrep .  The instrument was then slowly withdrawn as the colon was fully examined.     COLON FINDINGS: Three smooth sessile polyps ranging between 5-25mm in size were found throughout the entire examined colon.  A polypectomy was performed with cold forceps and using snare cautery.  The resection was complete and the polyp tissue was completely retrieved.   Mild diverticulosis was noted. Retroflexed views revealed no abnormalities.     The scope was then withdrawn from the patient and the procedure completed.  COMPLICATIONS: There were no complications. ENDOSCOPIC IMPRESSION: 1.   Three sessile polyps ranging between 5-42mm in size were found throughout the entire examined colon; polypectomy was performed with cold forceps and using snare cautery 2.   Mild diverticulosis was noted  RECOMMENDATIONS: 1.  Await pathology results 2.  High fiber diet   REPEAT EXAM: In 5 year(s)  for  Colonoscopy.  cc:  _______________________________ eSignedHart Carwin, MD 11/30/2012 9:12 AM     PATIENT NAME:  April, Floyd MR#: [id number]

## 2012-12-03 ENCOUNTER — Telehealth: Payer: Self-pay | Admitting: *Deleted

## 2012-12-03 NOTE — Telephone Encounter (Signed)
  Follow up Call-  Call back number 11/30/2012  Post procedure Call Back phone  # [phone]  Permission to leave phone message Yes     Patient questions:  Left message to call us if necessary.

## 2012-12-04 ENCOUNTER — Encounter: Payer: Self-pay | Admitting: Internal Medicine

## 2012-12-05 ENCOUNTER — Encounter: Payer: Commercial Managed Care - PPO | Admitting: Internal Medicine

## 2013-01-01 ENCOUNTER — Encounter: Payer: Commercial Managed Care - PPO | Admitting: Internal Medicine

## 2013-09-12 ENCOUNTER — Encounter: Payer: Self-pay | Admitting: Obstetrics & Gynecology

## 2013-11-29 ENCOUNTER — Other Ambulatory Visit: Payer: Self-pay | Admitting: Obstetrics & Gynecology

## 2013-12-02 NOTE — Telephone Encounter (Signed)
Pt is requesting a refill for vagi fem called into [facility]

## 2013-12-02 NOTE — Telephone Encounter (Signed)
eScribe request for refill on Vagifem 10 mcg #8 ,  1 tab per vagina 2x weekly Last filled -  Last AEX - 11-21-12 Next AEX - 01-10-14 with Dr Hyacinth Meeker  Will refill x1 until AEX.

## 2014-01-01 ENCOUNTER — Ambulatory Visit: Payer: Self-pay | Admitting: Obstetrics & Gynecology

## 2014-01-03 ENCOUNTER — Other Ambulatory Visit: Payer: Self-pay | Admitting: *Deleted

## 2014-01-03 ENCOUNTER — Ambulatory Visit: Payer: Self-pay | Admitting: Obstetrics & Gynecology

## 2014-01-03 MED ORDER — ESTRADIOL 10 MCG VA TABS: ORAL_TABLET | VAGINAL | Status: DC

## 2014-01-03 NOTE — Telephone Encounter (Signed)
Last filled - 11/29/13 for 1 month. Last AEX - 11-21-12  Next AEX -  01-10-14 with Dr Sabra Heck  Will refill x1 week until AEX.

## 2014-01-09 ENCOUNTER — Encounter: Payer: Self-pay | Admitting: Obstetrics & Gynecology

## 2014-01-10 ENCOUNTER — Encounter: Payer: Self-pay | Admitting: Obstetrics & Gynecology

## 2014-01-10 ENCOUNTER — Ambulatory Visit (INDEPENDENT_AMBULATORY_CARE_PROVIDER_SITE_OTHER): Payer: BC Managed Care – PPO | Admitting: Obstetrics & Gynecology

## 2014-01-10 VITALS — BP 98/60 | HR 60 | Resp 16 | Ht 65.75 in | Wt 140.6 lb

## 2014-01-10 DIAGNOSIS — Z0000 Encounter for general adult medical examination without abnormal findings: Secondary | ICD-10-CM

## 2014-01-10 DIAGNOSIS — Z01419 Encounter for gynecological examination (general) (routine) without abnormal findings: Secondary | ICD-10-CM

## 2014-01-10 LAB — POCT URINALYSIS DIPSTICK
Bilirubin, UA: NEGATIVE
Glucose, UA: NEGATIVE
Ketones, UA: NEGATIVE
LEUKOCYTES UA: NEGATIVE
NITRITE UA: NEGATIVE
PH UA: 5
Protein, UA: NEGATIVE
RBC UA: NEGATIVE
UROBILINOGEN UA: NEGATIVE

## 2014-01-10 MED ORDER — TEMAZEPAM 15 MG PO CAPS: 15.0000 mg | ORAL_CAPSULE | Freq: Every evening | ORAL | Status: DC | PRN

## 2014-01-10 MED ORDER — ESTRADIOL 10 MCG VA TABS: ORAL_TABLET | VAGINAL | Status: DC

## 2014-01-10 NOTE — Progress Notes (Signed)
63 y.o. G2P2 MarriedCaucasianF here for annual exam.  Doing really well.  No vaginal bleeding.  Daughter, Apolonio Schneiders, is teaching ESL.  Quillian Quince, son, is very busy with his music.  He played at [facility] last year.    Labs with Dr. Forde Dandy.  Copies from 2013 in chart.   Patient's last menstrual period was 07/20/2007.          Sexually active: yes  The current method of family planning is status post hysterectomy.    Exercising: yes  5 x weekly Smoker:  no  Health Maintenance: Pap:  09/09/10 WNL History of abnormal Pap:  no MMG:  Per patient 6 months ago at [facility] Colonoscopy:  12/13, Dr. Olevia Perches, 3 polyps, f/u 5 years BMD:   2010 TDaP:  2013 for [place] trip Screening Labs: today, Hb today: today, Urine today: negative   reports that she has never smoked. She has never used smokeless tobacco. She reports that she does not drink alcohol or use illicit drugs.  Past Medical History  Diagnosis Date  . Thyroid cancer 2003  . DVT (deep venous thrombosis) 2003    after thyroid surgery  . Fibroid   . Colonic polyp   . Insomnia   . Hamartoma     right breast  . Hypertension     with pregnancy    Past Surgical History  Procedure Laterality Date  . Thyroidectomy  2003  . Cesarean section  1985 and 1987  . Abdominal hysterectomy  2009  . Cervical polyp resection      Current Outpatient Prescriptions  Medication Sig Dispense Refill  . Estradiol (VAGIFEM) 10 MCG TABS vaginal tablet Insert 1 tablet vaginally 2 times a week as directed.  2 tablet  0  . Multiple Vitamin (MULTIVITAMIN) tablet Take 1 tablet by mouth daily.      Marland Kitchen SYNTHROID 137 MCG tablet Take 137 mcg by mouth daily.       . temazepam (RESTORIL) 15 MG capsule Take 15 mg by mouth at bedtime as needed.        No current facility-administered medications for this visit.    Family History  Problem Relation Age of Onset  . Colon cancer Neg Hx   . Stomach cancer Neg Hx   . Ovarian cancer Sister   . Diabetes Mother   .  Pancreatic cancer Paternal Aunt   . Cancer Maternal Aunt     ? type  . Asthma Son     ROS:  Pertinent items are noted in HPI.  Otherwise, a comprehensive ROS was negative.  Exam:   BP 98/60  Pulse 60  Resp 16  Ht 5' 5.75" (1.67 m)  Wt 140 lb 9.6 oz (63.776 kg)  BMI 22.87 kg/m2  LMP 07/20/2007  Weight change: 3lb  Height: 5' 5.75" (167 cm)  Ht Readings from Last 3 Encounters:  01/10/14 5' 5.75" (1.67 m)  11/30/12 5\' 6"  (1.676 m)  11/21/12 5\' 6"  (1.676 m)    General appearance: alert, cooperative and appears stated age Head: Normocephalic, without obvious abnormality, atraumatic Neck: no adenopathy, supple, symmetrical, trachea midline and thyroid normal to inspection and palpation Lungs: clear to auscultation bilaterally Breasts: normal appearance, no masses or tenderness Heart: regular rate and rhythm Abdomen: soft, non-tender; bowel sounds normal; no masses,  no organomegaly Extremities: extremities normal, atraumatic, no cyanosis or edema Skin: Skin color, texture, turgor normal. No rashes or lesions Lymph nodes: Cervical, supraclavicular, and axillary nodes normal. No abnormal inguinal nodes palpated Neurologic: Grossly  normal   Pelvic: External genitalia:  no lesions              Urethra:  normal appearing urethra with no masses, tenderness or lesions              Bartholins and Skenes: normal                 Vagina: normal appearing vagina with normal color and discharge, no lesions              Cervix: absent              Pap taken: no Bimanual Exam:  Uterus:  uterus absent              Adnexa: normal adnexa and no mass, fullness, tenderness               Rectovaginal: Confirms               Anus:  normal sphincter tone, no lesions  A:  Well Woman with normal exam PMP, no HRT H/O TAH H/O DVT while having treatment for thyroid cancer Insomnia  P:   mammogram pap smear Restoril 15mg  qhs prn insomnia Vagifem 19meq pv twice weekly.  #24/4RF return annually  or prn  An After Visit Summary was printed and given to the patient.

## 2014-01-10 NOTE — Patient Instructions (Signed)

## 2014-01-11 LAB — COMPREHENSIVE METABOLIC PANEL
ALT: 12 U/L (ref 0–35)
AST: 21 U/L (ref 0–37)
Albumin: 4.9 g/dL (ref 3.5–5.2)
Alkaline Phosphatase: 48 U/L (ref 39–117)
BILIRUBIN TOTAL: 0.6 mg/dL (ref 0.3–1.2)
BUN: 11 mg/dL (ref 6–23)
CALCIUM: 9.9 mg/dL (ref 8.4–10.5)
CO2: 30 meq/L (ref 19–32)
Chloride: 99 mEq/L (ref 96–112)
Creat: 0.75 mg/dL (ref 0.50–1.10)
Glucose, Bld: 88 mg/dL (ref 70–99)
POTASSIUM: 4.3 meq/L (ref 3.5–5.3)
Sodium: 140 mEq/L (ref 135–145)
Total Protein: 7.4 g/dL (ref 6.0–8.3)

## 2014-01-11 LAB — LIPID PANEL
Cholesterol: 220 mg/dL — ABNORMAL HIGH (ref 0–200)
HDL: 91 mg/dL (ref >39)
LDL CALC: 119 mg/dL — AB (ref 0–99)
Total CHOL/HDL Ratio: 2.4 Ratio
Triglycerides: 49 mg/dL (ref <150)
VLDL: 10 mg/dL (ref 0–40)

## 2014-01-24 ENCOUNTER — Telehealth: Payer: Self-pay | Admitting: Obstetrics & Gynecology

## 2014-01-24 NOTE — Telephone Encounter (Signed)
Pt is returning call to kelly. No message in system.

## 2014-01-24 NOTE — Telephone Encounter (Signed)
Return call to patient. LMTCB. 

## 2014-01-24 NOTE — Telephone Encounter (Signed)
Return call to Sally. °

## 2014-02-04 NOTE — Telephone Encounter (Signed)
Late note. Spoke to patient on 01-24-14 and reviewed BMD results per Dr Sabra Heck.  See scanned copy

## 2014-02-12 NOTE — Telephone Encounter (Signed)
Estradiol (VAGIFEM) 10 MCG TABS vaginal tablet  Insert 1 tablet vaginally 2 times a week as directed., Normal, Last Dose: Not Recorded  Refills: 4 ordered Pharmacy: [facility], [address]. Patient wants to know if there is a cheaper alternative to medication. Insurance change and is now costing $150

## 2014-02-12 NOTE — Telephone Encounter (Signed)
Message left to return call to Ludy Messamore at [phone].    

## 2014-02-14 NOTE — Telephone Encounter (Signed)
Spoke with patient. She has a 4,000 deductible she must meet before any prescription is covered and then it is at 20%. Patient wanted to know if there would be anything that may be cheaper alternative to Vagifem. I advised I would send request to Dr. Sabra Heck for advice.

## 2014-02-17 NOTE — Telephone Encounter (Signed)
She had a DVT after thyroid surgery.  So, using a different (stronger estrogen) is not a good option.  She could try Replens vaginal moisturizer which is OTC.  Have her give me and update after a few months.

## 2014-02-18 NOTE — Telephone Encounter (Signed)
LMTCB

## 2014-02-19 NOTE — Telephone Encounter (Signed)
Spoke with patient and message given. She will try replens and return call prn.   Routing to provider for final review. Patient agreeable to disposition. Will close encounter

## 2014-03-10 ENCOUNTER — Telehealth: Payer: Self-pay

## 2014-03-10 NOTE — Telephone Encounter (Signed)
Patient notified of BMD results.

## 2014-03-10 NOTE — Telephone Encounter (Signed)
lmtcb-to discuss BMD results. 

## 2014-10-20 ENCOUNTER — Encounter: Payer: Self-pay | Admitting: Obstetrics & Gynecology

## 2015-01-16 ENCOUNTER — Other Ambulatory Visit: Payer: Self-pay | Admitting: Obstetrics & Gynecology

## 2015-01-16 NOTE — Telephone Encounter (Signed)
Medication refill request: Vagifem 10 mcg tabs Last AEX:  01/10/14 Next AEX: 01/23/15 Last MMG (if hormonal medication request): 02/19/14 [id number]:Neg Refill authorized: 01/10/14 #24/4R. Today #8/0R?

## 2015-01-22 ENCOUNTER — Ambulatory Visit: Payer: BC Managed Care – PPO | Admitting: Obstetrics & Gynecology

## 2015-01-23 ENCOUNTER — Encounter: Payer: Self-pay | Admitting: Obstetrics & Gynecology

## 2015-01-23 ENCOUNTER — Ambulatory Visit (INDEPENDENT_AMBULATORY_CARE_PROVIDER_SITE_OTHER): Payer: BLUE CROSS/BLUE SHIELD | Admitting: Obstetrics & Gynecology

## 2015-01-23 VITALS — BP 108/70 | HR 68 | Resp 16 | Ht 65.75 in | Wt 140.6 lb

## 2015-01-23 DIAGNOSIS — Z01419 Encounter for gynecological examination (general) (routine) without abnormal findings: Secondary | ICD-10-CM

## 2015-01-23 DIAGNOSIS — Z0000 Encounter for general adult medical examination without abnormal findings: Secondary | ICD-10-CM

## 2015-01-23 MED ORDER — ESTRADIOL 10 MCG VA TABS: ORAL_TABLET | VAGINAL | Status: DC

## 2015-01-23 MED ORDER — TEMAZEPAM 15 MG PO CAPS: 15.0000 mg | ORAL_CAPSULE | Freq: Every evening | ORAL | Status: DC | PRN

## 2015-01-23 NOTE — Progress Notes (Signed)
64 y.o. G2P2 MarriedCaucasianF here for annual exam.  Doing well.  A little frustrated with weight.  Looked through paper chart.  She weight 141 about 10 years ago.    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  She and her husband went with their friends, the Kimmels, to this.     Patient's last menstrual period was 07/20/2007.          Sexually active: Yes.    The current method of family planning is status post hysterectomy.    Exercising: Yes.    aerobic dance Smoker:  no  Health Maintenance: Pap:  09/09/10 WNL History of abnormal Pap:  no MMG:  02/19/14 3D-normal Colonoscopy:  12/13-repeat 5 years BMD:   3/15 TDaP:  2013  Screening Labs: with Dr. Forde Dandy, Hb today: n/a, Urine today: n/a   reports that she has never smoked. She has never used smokeless tobacco. She reports that she does not drink alcohol or use illicit drugs.  Past Medical History  Diagnosis Date  . Thyroid cancer 2003  . DVT (deep venous thrombosis) 2003    after thyroid surgery  . Fibroid   . Colonic polyp   . Insomnia   . Hamartoma     right breast  . Hypertension     with pregnancy    Past Surgical History  Procedure Laterality Date  . Thyroidectomy  2003  . Cesarean section  1985 and 1987  . Abdominal hysterectomy  2009  . Cervical polyp resection      Current Outpatient Prescriptions  Medication Sig Dispense Refill  . Multiple Vitamin (MULTIVITAMIN) tablet Take 1 tablet by mouth daily.    Marland Kitchen SYNTHROID 137 MCG tablet Take 137 mcg by mouth daily.     . temazepam (RESTORIL) 15 MG capsule Take 1 capsule (15 mg total) by mouth at bedtime as needed. 30 capsule 3  . VAGIFEM 10 MCG TABS vaginal tablet INSERT 1 TABLET VAGINALLY 2 TIMES A WEEK AS DIRECTED. 8 tablet 0   No current facility-administered medications for this visit.    Family History  Problem Relation Age of Onset  . Colon cancer Neg Hx   . Stomach cancer Neg Hx   . Ovarian cancer Sister   . Diabetes Mother   . Pancreatic cancer Paternal  Aunt   . Cancer Maternal Aunt     ? type  . Asthma Son     ROS:  Pertinent items are noted in HPI.  Otherwise, a comprehensive ROS was negative.  Exam:   LMP 07/20/2007  Weight change: @WEIGHTCHANGE @ Height:      Ht Readings from Last 3 Encounters:  01/10/14 5' 5.75" (1.67 m)  11/30/12 5\' 6"  (1.676 m)  11/21/12 5\' 6"  (1.676 m)    General appearance: alert, cooperative and appears stated age Head: Normocephalic, without obvious abnormality, atraumatic Neck: no adenopathy, supple, symmetrical, trachea midline and thyroid normal to inspection and palpation Lungs: clear to auscultation bilaterally Breasts: normal appearance, no masses or tenderness Heart: regular rate and rhythm Abdomen: soft, non-tender; bowel sounds normal; no masses,  no organomegaly Extremities: extremities normal, atraumatic, no cyanosis or edema Skin: Skin color, texture, turgor normal. No rashes or lesions Lymph nodes: Cervical, supraclavicular, and axillary nodes normal. No abnormal inguinal nodes palpated Neurologic: Grossly normal   Pelvic: External genitalia:  no lesions              Urethra:  normal appearing urethra with no masses, tenderness or lesions  Bartholins and Skenes: normal                 Vagina: normal appearing vagina with normal color and discharge, no lesions              Cervix: absent              Pap taken: No. Bimanual Exam:  Uterus:  uterus absent              Adnexa: no mass, fullness, tenderness               Rectovaginal: Confirms               Anus:  normal sphincter tone, no lesions  Chaperone was present for exam.  A:  Well Woman with normal exam PMP, no HRT H/O TAH H/O DVT while having treatment for thyroid cancer Thyroid cancer Insomnia  P: mammogram yearly pap smear not indicated Restoril 15mg  qhs prn insomnia.  #30/2RF Vagifem 5meq pv twice weekly. #24/4RF return annually or prn

## 2015-02-20 ENCOUNTER — Telehealth: Payer: Self-pay

## 2015-02-20 ENCOUNTER — Other Ambulatory Visit (HOSPITAL_COMMUNITY): Payer: Self-pay | Admitting: Endocrinology

## 2015-02-20 ENCOUNTER — Other Ambulatory Visit: Payer: Self-pay | Admitting: Endocrinology

## 2015-02-20 DIAGNOSIS — C73 Malignant neoplasm of thyroid gland: Secondary | ICD-10-CM

## 2015-02-20 NOTE — Telephone Encounter (Signed)
Lmtcb//kn Spoke with Peter Congo re: Thyrogen scan, states she has scheduled the patient at [facility] on 3/21. Dr Forde Dandy is out of the office, so she does not have an official order. She will call the patient and explain this to her. Will call us back if there is any change.//kn

## 2015-03-09 ENCOUNTER — Encounter (HOSPITAL_COMMUNITY): Payer: BLUE CROSS/BLUE SHIELD

## 2015-03-10 ENCOUNTER — Encounter (HOSPITAL_COMMUNITY): Payer: BLUE CROSS/BLUE SHIELD

## 2015-03-11 ENCOUNTER — Encounter (HOSPITAL_COMMUNITY): Payer: BLUE CROSS/BLUE SHIELD

## 2015-03-13 ENCOUNTER — Encounter (HOSPITAL_COMMUNITY): Payer: BLUE CROSS/BLUE SHIELD

## 2015-03-16 ENCOUNTER — Encounter: Payer: Self-pay | Admitting: Obstetrics & Gynecology

## 2015-04-20 ENCOUNTER — Encounter (HOSPITAL_COMMUNITY)
Admission: RE | Admit: 2015-04-20 | Discharge: 2015-04-20 | Disposition: A | Discharge status: Home / Self Care | Payer: BLUE CROSS/BLUE SHIELD | Source: Ambulatory Visit | Attending: Endocrinology | Admitting: Endocrinology

## 2015-04-20 DIAGNOSIS — C73 Malignant neoplasm of thyroid gland: Secondary | ICD-10-CM | POA: Insufficient documentation

## 2015-04-20 MED ORDER — THYROTROPIN ALFA 1.1 MG IM SOLR
0.9000 mg | INTRAMUSCULAR | Status: AC
  Administered 2015-04-20: 0.9 mg via INTRAMUSCULAR
  Filled 2015-04-20: qty 0.9

## 2015-04-21 ENCOUNTER — Encounter (HOSPITAL_COMMUNITY)
Admission: RE | Admit: 2015-04-21 | Discharge: 2015-04-21 | Disposition: A | Discharge status: Home / Self Care | Payer: BLUE CROSS/BLUE SHIELD | Source: Ambulatory Visit | Attending: Endocrinology | Admitting: Endocrinology

## 2015-04-21 DIAGNOSIS — C73 Malignant neoplasm of thyroid gland: Secondary | ICD-10-CM | POA: Diagnosis not present

## 2015-04-21 MED ORDER — THYROTROPIN ALFA 1.1 MG IM SOLR
0.9000 mg | INTRAMUSCULAR | Status: AC
  Administered 2015-04-21: 0.9 mg via INTRAMUSCULAR
  Filled 2015-04-21: qty 0.9

## 2015-04-22 ENCOUNTER — Encounter (HOSPITAL_COMMUNITY)
Admission: RE | Admit: 2015-04-22 | Discharge: 2015-04-22 | Disposition: A | Discharge status: Home / Self Care | Payer: BLUE CROSS/BLUE SHIELD | Source: Ambulatory Visit | Attending: Endocrinology | Admitting: Endocrinology

## 2015-04-24 ENCOUNTER — Encounter (HOSPITAL_COMMUNITY)
Admission: RE | Admit: 2015-04-24 | Discharge: 2015-04-24 | Disposition: A | Discharge status: Home / Self Care | Payer: BLUE CROSS/BLUE SHIELD | Source: Ambulatory Visit | Attending: Endocrinology | Admitting: Endocrinology

## 2015-04-24 DIAGNOSIS — C73 Malignant neoplasm of thyroid gland: Secondary | ICD-10-CM | POA: Diagnosis not present

## 2015-04-24 MED ORDER — SODIUM IODIDE I 131 CAPSULE
4.0000 | Freq: Once | INTRAVENOUS | Status: AC | PRN
  Administered 2015-04-24: 4 via ORAL

## 2015-04-27 LAB — THYROGLOBULIN ANTIBODY: Thyroglobulin Antibody: 1 IU/mL — ABNORMAL HIGH (ref 0.0–0.9)

## 2015-05-04 LAB — THYROGLOBULIN LEVEL: Thyroglobulin: 6.2 ng/mL

## 2015-06-23 ENCOUNTER — Ambulatory Visit (INDEPENDENT_AMBULATORY_CARE_PROVIDER_SITE_OTHER): Payer: BLUE CROSS/BLUE SHIELD | Admitting: Internal Medicine

## 2015-06-23 VITALS — BP 120/76 | HR 74 | Temp 97.8°F | Resp 17 | Ht 67.0 in | Wt 136.8 lb

## 2015-06-23 DIAGNOSIS — J0100 Acute maxillary sinusitis, unspecified: Secondary | ICD-10-CM

## 2015-06-23 MED ORDER — AMOXICILLIN 875 MG PO TABS: 875.0000 mg | ORAL_TABLET | Freq: Two times a day (BID) | ORAL | Status: DC

## 2015-06-23 NOTE — Progress Notes (Signed)
   Subjective:  This chart was scribed for Tami Lin, MD by Leandra Kern, Medical Scribe. This patient was seen in Room 12 and the patient's care was started at 11:57 AM.   Patient ID: April Floyd, female    DOB: 29-Aug-1951, 64 y.o.   MRN: [id number]  HPI HPI Comments: April Floyd is a 64 y.o. female who presents to [facility] complaining of sore throat, onset 3 days ago.  Pt reports associated symptoms of Otalgia, sore throat, sneezing, and congestion. Pt notes that her husband has recently had a sinus infection about a week ago.  Pt is only taking Synthroid as a medication.   Daniel and rachel kids--[employer] Active Ambulatory Problems    Diagnosis Date Noted  . THYROID CANCER 01/09/2008  . PULMONARY EMBOLISM 01/09/2008  . DVT 01/09/2008  . LYMPHADENOPATHY 01/10/2008   Resolved Ambulatory Problems    Diagnosis Date Noted  . No Resolved Ambulatory Problems   Past Medical History  Diagnosis Date  . Thyroid cancer 2003  . DVT (deep venous thrombosis) 2003  . Fibroid   . Colonic polyp   . Insomnia   . Hamartoma   . Hypertension    Current Outpatient Prescriptions on File Prior to Visit  Medication Sig Dispense Refill  . Estradiol (VAGIFEM) 10 MCG TABS vaginal tablet INSERT 1 TABLET VAGINALLY 2 TIMES A WEEK AS DIRECTED. 24 tablet 4  . Multiple Vitamin (MULTIVITAMIN) tablet Take 1 tablet by mouth daily.    Marland Kitchen SYNTHROID 137 MCG tablet Take 137 mcg by mouth daily.     . temazepam (RESTORIL) 15 MG capsule Take 1 capsule (15 mg total) by mouth at bedtime as needed. (Patient not taking: Reported on 06/23/2015) 30 capsule 3   No current facility-administered medications on file prior to visit.   No Known Allergies   Review of Systems  HENT: Positive for congestion, ear pain, sneezing and sore throat.       Objective:   Physical Exam  Constitutional: She is oriented to person, place, and time. She appears well-developed and  well-nourished. No distress.  HENT:  Head: Normocephalic and atraumatic.  Right Ear: External ear normal.  Left Ear: External ear normal.  Mouth/Throat: Oropharynx is clear and moist. No oropharyngeal exudate.  Purulent discharge nares with tenderness in the maxillary areas  Eyes: Conjunctivae and EOM are normal. Pupils are equal, round, and reactive to light.  Neck: Neck supple.  Cardiovascular: Normal rate.   Pulmonary/Chest: Effort normal and breath sounds normal. No respiratory distress.  Neurological: She is alert and oriented to person, place, and time. No cranial nerve deficit.  Skin: Skin is warm and dry.  Psychiatric: She has a normal mood and affect. Her behavior is normal.  Nursing note and vitals reviewed.     Assessment & Plan:  I have completed the patient encounter in its entirety as documented by the scribe, with editing by me where necessary. Mcdaniel Ohms P. Laney Pastor, M.D. Acute maxillary sinusitis, recurrence not specified  Meds ordered this encounter  Medications  . amoxicillin (AMOXIL) 875 MG tablet    Sig: Take 1 tablet (875 mg total) by mouth 2 (two) times daily.    Dispense:  20 tablet    Refill:  0

## 2015-08-25 ENCOUNTER — Other Ambulatory Visit: Payer: Self-pay | Admitting: *Deleted

## 2015-08-25 MED ORDER — TEMAZEPAM 15 MG PO CAPS: 15.0000 mg | ORAL_CAPSULE | Freq: Every evening | ORAL | Status: DC | PRN

## 2015-08-25 NOTE — Telephone Encounter (Signed)
Medication refill request: Temazepam Last AEX:  01-23-15 Next AEX: 03-29-16 Last MMG (if hormonal medication request): 02-19-14 WNL  Refill authorized: please advise

## 2016-03-14 ENCOUNTER — Other Ambulatory Visit: Payer: Self-pay | Admitting: Obstetrics & Gynecology

## 2016-03-14 NOTE — Telephone Encounter (Signed)
Medication refill request:Vagifem & Restoril Last AEX:  01-23-15 Next AEX: 4-11-7 Last MMG (if hormonal medication request): 08-20-15 WNL Refill authorized: please advise

## 2016-03-14 NOTE — Telephone Encounter (Signed)
Rx faxed to [facility] today

## 2016-03-29 ENCOUNTER — Ambulatory Visit: Payer: BLUE CROSS/BLUE SHIELD | Admitting: Obstetrics & Gynecology

## 2016-04-18 ENCOUNTER — Ambulatory Visit (INDEPENDENT_AMBULATORY_CARE_PROVIDER_SITE_OTHER): Payer: 59 | Admitting: Obstetrics & Gynecology

## 2016-04-18 ENCOUNTER — Encounter: Payer: Self-pay | Admitting: Obstetrics & Gynecology

## 2016-04-18 VITALS — BP 128/80 | HR 56 | Resp 16 | Ht 65.75 in | Wt 133.0 lb

## 2016-04-18 DIAGNOSIS — Z01419 Encounter for gynecological examination (general) (routine) without abnormal findings: Secondary | ICD-10-CM

## 2016-04-18 DIAGNOSIS — Z0000 Encounter for general adult medical examination without abnormal findings: Secondary | ICD-10-CM

## 2016-04-18 LAB — POCT URINALYSIS DIPSTICK
Bilirubin, UA: NEGATIVE
GLUCOSE UA: NEGATIVE
Ketones, UA: NEGATIVE
Leukocytes, UA: NEGATIVE
NITRITE UA: NEGATIVE
Protein, UA: NEGATIVE
RBC UA: NEGATIVE
Urobilinogen, UA: NEGATIVE
pH, UA: 7

## 2016-04-18 MED ORDER — ESTRADIOL 10 MCG VA TABS: ORAL_TABLET | VAGINAL | Status: DC

## 2016-04-18 MED ORDER — TEMAZEPAM 15 MG PO CAPS: 15.0000 mg | ORAL_CAPSULE | Freq: Every evening | ORAL | Status: DC | PRN

## 2016-04-18 NOTE — Patient Instructions (Signed)
Check with Dr. Forde Dandy about whether you've had the Hep C test.

## 2016-04-18 NOTE — Progress Notes (Signed)
Patient ID: April Floyd, female   DOB: 1951-06-09, 65 y.o.   MRN: [id number]   65 y.o. G2P2 MarriedCaucasianF here for annual exam.  Doing well.  No vaginal bleeding.  Has a niece living with them.  Niece has a 57 year old.    PCP:  Dr. Forde Dandy.  Just saw him and had thyroid testing.  Also reports her Vit D was low.    Patient's last menstrual period was 07/20/2007.          Sexually active: Yes.    The current method of family planning is status post hysterectomy.    Exercising: Yes.    Aerobics Smoker:  no  Health Maintenance: Pap: 09-09-10 WNL History of abnormal Pap:  no MMG:  08-20-15 WNL BI RADS 1 Colonoscopy:  11/30/12 repeat 5 years  BMD:  02-19-14 WNL  TDaP:  2013 Screening Labs: PCP, Urine today: Negative   reports that she has never smoked. She has never used smokeless tobacco. She reports that she does not drink alcohol or use illicit drugs.  Past Medical History  Diagnosis Date  . Thyroid cancer ([place]) 2003  . DVT (deep venous thrombosis) ([place]) 2003    after thyroid surgery  . Fibroid   . Colonic polyp   . Insomnia   . 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 Hypertension     with pregnancy    Past Surgical History  Procedure Laterality Date  . Thyroidectomy  2003  . Cesarean section  1985 and 1987  . Abdominal hysterectomy  2009  . Cervical polyp resection      Current Outpatient Prescriptions  Medication Sig Dispense Refill  . cholecalciferol (VITAMIN D) 1000 units tablet Take 1,000 Units by mouth daily.    . Multiple Vitamin (MULTIVITAMIN) tablet Take 1 tablet by mouth daily.    Marland Kitchen SYNTHROID 137 MCG tablet Take 137 mcg by mouth daily.     . temazepam (RESTORIL) 15 MG capsule TAKE 1 CAPSULE AT BEDTIME AS NEEDED FOR SLEEP. 30 capsule 0  . VAGIFEM 10 MCG TABS vaginal tablet INSERT 1 TABLET VAGINALLY 2 TIMES A WEEK AS DIRECTED. 8 tablet 0   No current facility-administered medications for this visit.    Family History  Problem Relation Age of Onset  . Colon  cancer Neg Hx   . Stomach cancer Neg Hx   . Ovarian cancer Sister   . Diabetes Mother   . Pancreatic cancer Paternal Aunt   . Cancer Maternal Aunt     ? type  . Asthma Son     ROS:  Pertinent items are noted in HPI.  Otherwise, a comprehensive ROS was negative.  Exam:   BP 128/80 mmHg  Pulse 56  Resp 16  Ht 5' 5.75" (1.67 m)  Wt 133 lb (60.328 kg)  BMI 21.63 kg/m2  LMP 07/20/2007  Weight change: -7#   Height: 5' 5.75" (167 cm)  Ht Readings from Last 3 Encounters:  04/18/16 5' 5.75" (1.67 m)  06/23/15 5\' 7"  (1.702 m)  01/23/15 5' 5.75" (1.67 m)   General appearance: alert, cooperative and appears stated age Head: Normocephalic, without obvious abnormality, atraumatic Neck: no adenopathy, supple, symmetrical, trachea midline and thyroid normal to inspection and palpation Lungs: clear to auscultation bilaterally Breasts: normal appearance, no masses or tenderness Heart: regular rate and rhythm Abdomen: soft, non-tender; bowel sounds normal; no masses,  no organomegaly Extremities: extremities normal, atraumatic, no cyanosis or edema Skin: Skin color, texture, turgor normal.  No rashes or lesions Lymph nodes: Cervical, supraclavicular, and axillary nodes normal. No abnormal inguinal nodes palpated Neurologic: Grossly normal   Pelvic: External genitalia:  no lesions              Urethra:  normal appearing urethra with no masses, tenderness or lesions              Bartholins and Skenes: normal                 Vagina: normal appearing vagina with normal color and discharge, no lesions               Cervix: absent              Pap taken: No. Bimanual Exam:  Uterus:  uterus absent              Adnexa: normal adnexa and no mass, fullness, tenderness               Rectovaginal: Confirms               Anus:  normal sphincter tone, no lesions  Chaperone was present for exam.  A:  Well Woman with normal exam PMP, no HRT H/O TAH H/O DVT while having treatment for thyroid  cancer H/O thyroid cancer Insomnia  P: Mammogram yearly pap smear not indicated Restoril 15mg  qhs prn insomnia. #30/1RF.  Pt takes about once a month. Vagifem 49meq pv twice weekly. #24/4RF return annually or prn

## 2016-10-19 ENCOUNTER — Other Ambulatory Visit: Payer: Self-pay | Admitting: Endocrinology

## 2016-10-19 ENCOUNTER — Other Ambulatory Visit (HOSPITAL_COMMUNITY): Payer: Self-pay | Admitting: Endocrinology

## 2016-10-19 DIAGNOSIS — C73 Malignant neoplasm of thyroid gland: Secondary | ICD-10-CM

## 2016-11-07 ENCOUNTER — Encounter (HOSPITAL_COMMUNITY): Payer: 59

## 2016-11-07 ENCOUNTER — Encounter (HOSPITAL_COMMUNITY)
Admission: RE | Admit: 2016-11-07 | Discharge: 2016-11-07 | Disposition: A | Discharge status: Home / Self Care | Payer: 59 | Source: Ambulatory Visit | Attending: Endocrinology | Admitting: Endocrinology

## 2016-11-07 DIAGNOSIS — C73 Malignant neoplasm of thyroid gland: Secondary | ICD-10-CM | POA: Diagnosis present

## 2016-11-07 MED ORDER — STERILE WATER FOR INJECTION IJ SOLN
INTRAMUSCULAR | Status: AC
  Filled 2016-11-07: qty 10

## 2016-11-07 MED ORDER — THYROTROPIN ALFA 1.1 MG IM SOLR
0.9000 mg | INTRAMUSCULAR | Status: AC
  Administered 2016-11-07: 0.9 mg via INTRAMUSCULAR

## 2016-11-08 ENCOUNTER — Encounter (HOSPITAL_COMMUNITY)
Admission: RE | Admit: 2016-11-08 | Discharge: 2016-11-08 | Disposition: A | Discharge status: Home / Self Care | Payer: 59 | Source: Ambulatory Visit | Attending: Endocrinology | Admitting: Endocrinology

## 2016-11-08 ENCOUNTER — Encounter (HOSPITAL_COMMUNITY): Payer: 59

## 2016-11-08 DIAGNOSIS — C73 Malignant neoplasm of thyroid gland: Secondary | ICD-10-CM | POA: Diagnosis not present

## 2016-11-08 MED ORDER — THYROTROPIN ALFA 1.1 MG IM SOLR
0.9000 mg | INTRAMUSCULAR | Status: AC
  Administered 2016-11-08: 0.9 mg via INTRAMUSCULAR

## 2016-11-08 MED ORDER — STERILE WATER FOR INJECTION IJ SOLN
INTRAMUSCULAR | Status: AC
  Filled 2016-11-08: qty 10

## 2016-11-09 ENCOUNTER — Encounter (HOSPITAL_COMMUNITY)
Admission: RE | Admit: 2016-11-09 | Discharge: 2016-11-09 | Disposition: A | Discharge status: Home / Self Care | Payer: 59 | Source: Ambulatory Visit | Attending: Endocrinology | Admitting: Endocrinology

## 2016-11-09 ENCOUNTER — Encounter (HOSPITAL_COMMUNITY): Payer: 59

## 2016-11-09 MED ORDER — SODIUM IODIDE I 131 CAPSULE
4.1000 | Freq: Once | INTRAVENOUS | Status: AC | PRN
  Administered 2016-11-09: 4.1 via ORAL

## 2016-11-11 ENCOUNTER — Encounter (HOSPITAL_COMMUNITY): Payer: 59

## 2016-11-11 ENCOUNTER — Encounter (HOSPITAL_COMMUNITY)
Admission: RE | Admit: 2016-11-11 | Discharge: 2016-11-11 | Disposition: A | Discharge status: Home / Self Care | Payer: 59 | Source: Ambulatory Visit | Attending: Endocrinology | Admitting: Endocrinology

## 2016-11-11 DIAGNOSIS — C73 Malignant neoplasm of thyroid gland: Secondary | ICD-10-CM | POA: Diagnosis not present

## 2016-11-11 MED ORDER — SODIUM IODIDE I 131 CAPSULE
4.0000 | Freq: Once | INTRAVENOUS | Status: AC | PRN
  Administered 2016-11-11: 4 via ORAL

## 2017-03-06 ENCOUNTER — Other Ambulatory Visit: Payer: Self-pay | Admitting: Obstetrics & Gynecology

## 2017-03-06 NOTE — Telephone Encounter (Signed)
Medication refill request: restoril  Last AEX:  04/18/16 SM  Next AEX: 07/25/17 SM  Last MMG (if hormonal medication request): 08/20/15 BIRADS1:neg  Refill authorized: 5//1/17 #30caps/1R. Today please advise.

## 2017-03-08 ENCOUNTER — Other Ambulatory Visit: Payer: Self-pay | Admitting: Obstetrics & Gynecology

## 2017-03-09 NOTE — Telephone Encounter (Signed)
Prescription was faxed to [facility] today.

## 2017-05-13 ENCOUNTER — Other Ambulatory Visit: Payer: Self-pay | Admitting: Obstetrics & Gynecology

## 2017-05-16 NOTE — Telephone Encounter (Signed)
Medication refill request: Estradiol Last AEX:  04/18/16 SM Next AEX: 07/25/17 SM Last MMG (if hormonal medication request): 08/20/15 BIRADS1, [place] B, [facility] Refill authorized: 04/18/16 #24 4R. Please advise. Thank you.

## 2017-07-25 ENCOUNTER — Encounter: Payer: Self-pay | Admitting: Obstetrics & Gynecology

## 2017-07-25 ENCOUNTER — Ambulatory Visit (INDEPENDENT_AMBULATORY_CARE_PROVIDER_SITE_OTHER): Payer: 59 | Admitting: Obstetrics & Gynecology

## 2017-07-25 VITALS — BP 102/68 | HR 72 | Resp 12 | Ht 65.5 in | Wt 132.4 lb

## 2017-07-25 DIAGNOSIS — Z01419 Encounter for gynecological examination (general) (routine) without abnormal findings: Secondary | ICD-10-CM

## 2017-07-25 DIAGNOSIS — Z0000 Encounter for general adult medical examination without abnormal findings: Secondary | ICD-10-CM

## 2017-07-25 MED ORDER — ESTRADIOL 10 MCG VA TABS: 1.0000 | ORAL_TABLET | VAGINAL | 3 refills | Status: DC

## 2017-07-25 NOTE — Progress Notes (Signed)
66 y.o. G2P2 Married Caucasian F here for annual exam.  Doing well.  No vaginal bleeding.  Saw Dr. Forde Dandy last September.  "Something" in the blood work was normal so she had a whole body scan.  This was negative reviewed today.   Denies vaginal bleeding.    Patient's last menstrual period was 07/20/2007.          Sexually active: Yes.    The current method of family planning is status post hysterectomy.    Exercising: Yes.    aerobic dance Smoker:  no  Health Maintenance: Pap:  09/09/10 normal  History of abnormal Pap:  no MMG:  08/20/15 BIRADS 1 negative- patient states she had one in 2017 at [facility] Colonoscopy:  11/30/12 polyps- Dr. Olevia Perches- repeat 5 years BMD:   02/19/14 normal  TDaP:  2013  Pneumonia vaccine(s):  never Zostavax:   never Hep C testing: donated blood within last 5 years Screening Labs: PCP, Hb today: PCP   reports that she has never smoked. She has never used smokeless tobacco. She reports that she does not drink alcohol or use drugs.  Past Medical History:  Diagnosis Date  . Colonic polyp   . DVT (deep venous thrombosis) ([place]) 2003   after thyroid surgery  . Fibroid   . Hamartoma ([place])    right breast  . Hypertension    with pregnancy  . Insomnia   . Thyroid cancer ([place]) 2003    Past Surgical History:  Procedure Laterality Date  . ABDOMINAL HYSTERECTOMY  2009  . cervical polyp resection    . [place]  . THYROIDECTOMY  2003    Current Outpatient Prescriptions  Medication Sig Dispense Refill  . Estradiol 10 MCG TABS vaginal tablet INSERT 1 TABLET VAGINALLY 2 TIMES A WEEK AS DIRECTED. 8 tablet 3  . Multiple Vitamin (MULTIVITAMIN) tablet Take 1 tablet by mouth daily.    Marland Kitchen SYNTHROID 137 MCG tablet Take 137 mcg by mouth daily.     . temazepam (RESTORIL) 15 MG capsule TAKE 1 CAPSULE AT BEDTIME AS NEEDED FOR SLEEP. 30 capsule 0   No current facility-administered medications for this visit.     Family History  Problem Relation Age of  Onset  . Colon cancer Neg Hx   . Stomach cancer Neg Hx   . Ovarian cancer Sister   . Diabetes Mother   . Pancreatic cancer Paternal Aunt   . Cancer Maternal Aunt        ? type  . Asthma Son     ROS:  Pertinent items are noted in HPI.  Otherwise, a comprehensive ROS was negative.  Exam:   BP 102/68 (BP Location: Right Arm, Patient Position: Sitting, Cuff Size: Normal)   Pulse 72   Resp 12   Ht 5' 5.5" (1.664 m)   Wt 132 lb 6.4 oz (60.1 kg)   LMP 07/20/2007   BMI 21.70 kg/m   Weight change: -3#   Height: 5' 5.5" (166.4 cm)  Ht Readings from Last 3 Encounters:  07/25/17 5' 5.5" (1.664 m)  04/18/16 5' 5.75" (1.67 m)  06/23/15 5\' 7"  (1.702 m)    General appearance: alert, cooperative and appears stated age Head: Normocephalic, without obvious abnormality, atraumatic Neck: no adenopathy, supple, symmetrical, trachea midline and thyroid normal to inspection and palpation Lungs: clear to auscultation bilaterally Breasts: normal appearance, no masses or tenderness Heart: regular rate and rhythm Abdomen: soft, non-tender; bowel sounds normal; no masses,  no organomegaly  Extremities: extremities normal, atraumatic, no cyanosis or edema Skin: Skin color, texture, turgor normal. No rashes or lesions Lymph nodes: Cervical, supraclavicular, and axillary nodes normal. No abnormal inguinal nodes palpated Neurologic: Grossly normal   Pelvic: External genitalia:  no lesions              Urethra:  normal appearing urethra with no masses, tenderness or lesions              Bartholins and Skenes: normal                 Vagina: normal appearing vagina with normal color and discharge, no lesions              Cervix: absent              Pap taken: No. Bimanual Exam:  Uterus:  uterus absent              Adnexa: no mass, fullness, tenderness               Rectovaginal: Confirms               Anus:  normal sphincter tone, no lesions  Chaperone was present for exam.  A:  Well Woman with  normal exam PMP, no HRT H/O TAH H/O DVT during treatment for thyroid cancer Insomnia that is improved  P:   Mammogram yearly.  Pt feels she is up to date.  Release signed for MMG from 2017 pap smear not indicated CBC, CMP, Vit D, lipids obtained today Vagifem 52meq pv twice weekly.  #24/4RF Return annually or prn

## 2017-07-25 NOTE — Patient Instructions (Signed)
Think about doing the new singles vaccines--Shingrix.

## 2017-07-26 LAB — COMPREHENSIVE METABOLIC PANEL
A/G RATIO: 2 (ref 1.2–2.2)
ALK PHOS: 55 IU/L (ref 39–117)
ALT: 9 IU/L (ref 0–32)
AST: 24 IU/L (ref 0–40)
Albumin: 4.6 g/dL (ref 3.6–4.8)
BUN/Creatinine Ratio: 13 (ref 12–28)
BUN: 11 mg/dL (ref 8–27)
Bilirubin Total: 0.3 mg/dL (ref 0.0–1.2)
CO2: 24 mmol/L (ref 20–29)
Calcium: 9.5 mg/dL (ref 8.7–10.3)
Chloride: 104 mmol/L (ref 96–106)
Creatinine, Ser: 0.86 mg/dL (ref 0.57–1.00)
GFR calc non Af Amer: 72 mL/min/{1.73_m2} (ref >59)
GFR, EST AFRICAN AMERICAN: 83 mL/min/{1.73_m2} (ref >59)
GLUCOSE: 86 mg/dL (ref 65–99)
Globulin, Total: 2.3 g/dL (ref 1.5–4.5)
POTASSIUM: 4.3 mmol/L (ref 3.5–5.2)
Sodium: 142 mmol/L (ref 134–144)
TOTAL PROTEIN: 6.9 g/dL (ref 6.0–8.5)

## 2017-07-26 LAB — LIPID PANEL
Chol/HDL Ratio: 2.4 ratio (ref 0.0–4.4)
Cholesterol, Total: 218 mg/dL — ABNORMAL HIGH (ref 100–199)
HDL: 91 mg/dL (ref >39)
LDL Calculated: 112 mg/dL — ABNORMAL HIGH (ref 0–99)
TRIGLYCERIDES: 77 mg/dL (ref 0–149)
VLDL CHOLESTEROL CAL: 15 mg/dL (ref 5–40)

## 2017-07-26 LAB — VITAMIN D 25 HYDROXY (VIT D DEFICIENCY, FRACTURES): Vit D, 25-Hydroxy: 30 ng/mL (ref 30.0–100.0)

## 2017-07-26 LAB — CBC
Hematocrit: 39.9 % (ref 34.0–46.6)
Hemoglobin: 13.3 g/dL (ref 11.1–15.9)
MCH: 28.8 pg (ref 26.6–33.0)
MCHC: 33.3 g/dL (ref 31.5–35.7)
MCV: 86 fL (ref 79–97)
Platelets: 227 10*3/uL (ref 150–379)
RBC: 4.62 x10E6/uL (ref 3.77–5.28)
RDW: 14.2 % (ref 12.3–15.4)
WBC: 5.7 10*3/uL (ref 3.4–10.8)

## 2017-09-18 ENCOUNTER — Other Ambulatory Visit: Payer: Self-pay | Admitting: Obstetrics & Gynecology

## 2017-09-29 IMAGING — NM NM [ID] THYROID CANCER METS WHOLE BODY W/ THYROGEN
5 series · 5 of 5 positions shown · non-contrast
Comparison: 04/24/2015

CLINICAL DATA: Papillary thyroid carcinoma with follicular variant
diagnosed 14 years ago, post total thyroidectomy follow by
radioactive iodine therapy with 105 millicuries of 0-QAQ sodium
iodide, RIGHT thing thyroglobulin level question metastatic disease

EXAM:
THYROGEN-STIMULATED 0-QAQ WHOLE BODY SCAN
TECHNIQUE: The patient received 0.9 mg Thyrogen intramuscularly every 24 hours
for two doses. On the third day the patient returned and received
the radiopharmaceutical, per orally. On the fifth day, the patient
returned and whole body planar images were obtained in the anterior
and posterior projections.
RADIOPHARMACEUTICALS:  4.1 mCi 0-QAQ sodium iodide orally

[Series 1: marker · 4.14mm/px · 1 of 1 slices shown (1 of 2)]
[im 1/1]
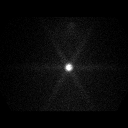

[Series 1: marker · 4.14mm/px · 1 of 1 slices shown (2 of 2)]
[im 1/1  full-range]
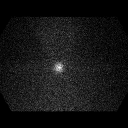

[Series 2: static thyroid no marker · 4.14mm/px · 1 of 1 slices shown]
[im 1/1  full-range]
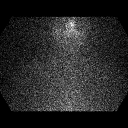

[Series 3: i131 whole body · 2.66mm/px · 1 of 1 slices shown (1 of 2)]
[im 1/1  full-range]
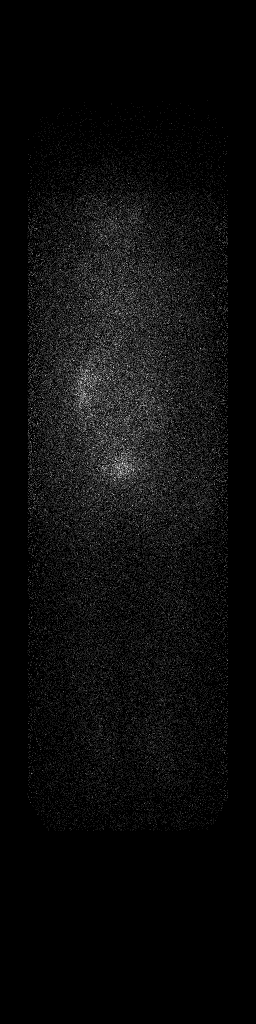

[Series 3: i131 whole body · 2.66mm/px · 1 of 1 slices shown (2 of 2)]
[im 1/1  full-range]
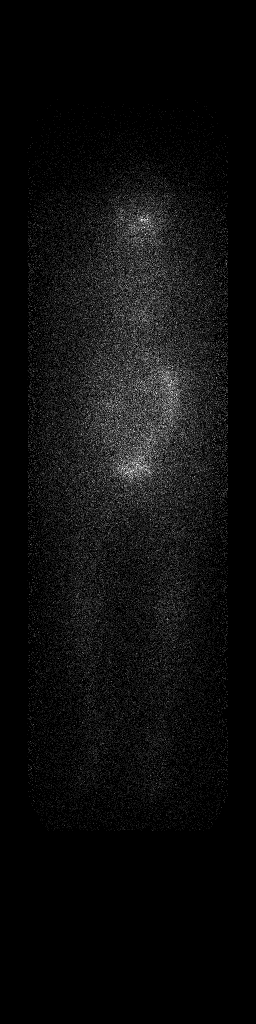

[5 of 5 positions shown; findings below may reference images not displayed]

FINDINGS: Excretion of tracer in colon and bladder.

No residual tracer localization at the thyroid bed.

No definite abnormal sites of radio iodine accumulation are
identified to suggest iodine-avid recurrent or metastatic disease.
IMPRESSION: No scintigraphic evidence of iodine-avid metastatic thyroid
carcinoma.

## 2017-12-27 ENCOUNTER — Other Ambulatory Visit: Payer: Self-pay | Admitting: Obstetrics & Gynecology

## 2017-12-27 NOTE — Telephone Encounter (Signed)
Medication refill request: Restoril  Last AEX:  07-25-17  Next AEX: 09-10-18  Last MMG (if hormonal medication request): 08-20-15 WNL  Refill authorized: please advise

## 2018-08-21 ENCOUNTER — Other Ambulatory Visit: Payer: Self-pay | Admitting: Obstetrics & Gynecology

## 2018-08-21 NOTE — Telephone Encounter (Signed)
Medication refill request: Temazepam Last AEX:  07/25/17 SM Next AEX: 08/27/18 SM Last MMG (if hormonal medication request): 08-20-15 WNL Refill authorized: 12/28/17 #30 w/0 refill; today please advise

## 2018-08-27 ENCOUNTER — Other Ambulatory Visit: Payer: Self-pay

## 2018-08-27 ENCOUNTER — Ambulatory Visit (INDEPENDENT_AMBULATORY_CARE_PROVIDER_SITE_OTHER): Payer: Medicare Other | Admitting: Obstetrics & Gynecology

## 2018-08-27 ENCOUNTER — Encounter: Payer: Self-pay | Admitting: Obstetrics & Gynecology

## 2018-08-27 ENCOUNTER — Telehealth: Payer: Self-pay | Admitting: *Deleted

## 2018-08-27 VITALS — BP 100/60 | HR 68 | Resp 14 | Ht 66.0 in | Wt 139.2 lb

## 2018-08-27 DIAGNOSIS — Z124 Encounter for screening for malignant neoplasm of cervix: Secondary | ICD-10-CM | POA: Diagnosis not present

## 2018-08-27 DIAGNOSIS — Z01419 Encounter for gynecological examination (general) (routine) without abnormal findings: Secondary | ICD-10-CM

## 2018-08-27 DIAGNOSIS — Z1211 Encounter for screening for malignant neoplasm of colon: Secondary | ICD-10-CM | POA: Diagnosis not present

## 2018-08-27 MED ORDER — ESTRADIOL 10 MCG VA TABS: 1.0000 | ORAL_TABLET | VAGINAL | 4 refills | Status: DC

## 2018-08-27 NOTE — Addendum Note (Signed)
Addended by: Megan Salon on: 08/27/2018 11:52 AM   Modules accepted: Orders

## 2018-08-27 NOTE — Patient Instructions (Signed)
[facility] Phone: [phone] ([phone] Location: Lower level of [facility] ([address]) Hours: 7:30 a.m. to 6:00 p.m., Monday through Friday.   [facility] Phone: [phone] Location: [address] Hours: 7:30 a.m. to 6:00 p.m., Monday through Friday.  

## 2018-08-27 NOTE — Progress Notes (Addendum)
67 y.o. G2P2 MarriedCaucasianF here for annual exam.  Daughter, Apolonio Schneiders, is expecting again.  She had HELLP syndrome with her first pregnancy.  Delivered at around 28 weeks so they are happy but nervous.  Son and daughter in law are traveling around working on their music.  Denies vaginal bleeding.    PCP:  Dr. Forde Dandy.  Has not seen him this year.  Typically sees him in September.  Will double check about this.  Patient's last menstrual period was 07/20/2007.          Sexually active: Yes.    The current method of family planning is status post hysterectomy.    Exercising: Yes.    aerobic dance Smoker:  no  Health Maintenance: Pap:  2011 normal  History of abnormal Pap:  no MMG:  09/12/17. [facility] faxing report now  Colonoscopy:  11/30/12 polyp. F/u 5 years  BMD:   02/19/14 Normal  TDaP:  2013 Pneumonia vaccine(s):  n/a Shingrix:  D/w pt today Hep C testing: Dr. Forde Dandy does this Screening Labs: PCP   reports that she has never smoked. She has never used smokeless tobacco. She reports that she does not drink alcohol or use drugs.  Past Medical History:  Diagnosis Date  . Colonic polyp   . DVT (deep venous thrombosis) ([place]) 2003   after thyroid surgery  . Fibroid   . Hamartoma ([place])    right breast  . Hypertension    with pregnancy  . Insomnia   . Thyroid cancer ([place]) 2003    Past Surgical History:  Procedure Laterality Date  . ABDOMINAL HYSTERECTOMY  2009  . cervical polyp resection    . [place]  . THYROIDECTOMY  2003    Current Outpatient Medications  Medication Sig Dispense Refill  . Estradiol 10 MCG TABS vaginal tablet Place 1 tablet (10 mcg total) vaginally 2 (two) times a week. 24 tablet 3  . Multiple Vitamin (MULTIVITAMIN) tablet Take 1 tablet by mouth daily.    Marland Kitchen SYNTHROID 137 MCG tablet Take 137 mcg by mouth daily.     . temazepam (RESTORIL) 15 MG capsule TAKE 1 CAPSULE AT BEDTIME AS NEEDED FOR SLEEP. 30 capsule 0   No current  facility-administered medications for this visit.     Family History  Problem Relation Age of Onset  . Ovarian cancer Sister   . Diabetes Mother   . Pancreatic cancer Paternal Aunt   . Cancer Maternal Aunt        ? type  . Asthma Son   . Colon cancer Neg Hx   . Stomach cancer Neg Hx     Review of Systems  All other systems reviewed and are negative.   Exam:   BP 100/60 (BP Location: Right Arm, Patient Position: Sitting, Cuff Size: Normal)   Pulse 68   Resp 14   Ht 5\' 6"  (1.676 m)   Wt 139 lb 3.2 oz (63.1 kg)   LMP 07/20/2007   BMI 22.47 kg/m    Height: 5\' 6"  (167.6 cm)  Ht Readings from Last 3 Encounters:  08/27/18 5\' 6"  (1.676 m)  07/25/17 5' 5.5" (1.664 m)  04/18/16 5' 5.75" (1.67 m)    General appearance: alert, cooperative and appears stated age Head: Normocephalic, without obvious abnormality, atraumatic Neck: no adenopathy, supple, symmetrical, trachea midline and thyroid normal to inspection and palpation Lungs: clear to auscultation bilaterally Breasts: normal appearance, no masses or tenderness Heart: regular rate and rhythm Abdomen: soft,  non-tender; bowel sounds normal; no masses,  no organomegaly Extremities: extremities normal, atraumatic, no cyanosis or edema Skin: Skin color, texture, turgor normal. No rashes or lesions Lymph nodes: Cervical, supraclavicular, and axillary nodes normal. No abnormal inguinal nodes palpated Neurologic: Grossly normal   Pelvic: External genitalia:  no lesions              Urethra:  normal appearing urethra with no masses, tenderness or lesions              Bartholins and Skenes: normal                 Vagina: normal appearing vagina with normal color and discharge, no lesions              Cervix: absent              Pap taken: No. Bimanual Exam:  Uterus:  uterus absent              Adnexa: no mass, fullness, tenderness               Rectovaginal: Confirms               Anus:  normal sphincter tone, no  lesions  Chaperone was present for exam.  A:  Well Woman with normal exam PMP, no HRT H/o TAH H/O DVT during treatment for thyroid cancer H/O thyroid cancer Chronic insomnia Sister with hx of ovarian cancer  P:   Mammogram yearly.  This is due at the end of the month. pap smear not indicated Colonoscopy is due.  Will double check about this with [facility].  Referral will be placed if needed Does not need Restoril rx.  Had #30/0RF and does not need RF for this. Vagifem 41meq pv twice weekly.  #24/4RF Thyroid blood work will be done with Dr. Forde Dandy at the end of the month BMD due next year Information about Shingles vaccine given return annually or prn

## 2018-08-27 NOTE — Telephone Encounter (Signed)
Called [facility]. Patient last Colonoscopy was in 11/2012. She was due last year. [facility] sent a reminder letter but patient did not call to schedule appt.   Left voicemail for patient to let her know Dr. Sabra Heck placed a referral for GI since she is due for colonoscopy now.  Encounter closed.

## 2018-08-28 ENCOUNTER — Encounter: Payer: Self-pay | Admitting: Obstetrics & Gynecology

## 2018-09-10 ENCOUNTER — Ambulatory Visit: Payer: 59 | Admitting: Obstetrics & Gynecology

## 2018-10-09 ENCOUNTER — Encounter: Payer: Self-pay | Admitting: Gastroenterology

## 2018-12-04 ENCOUNTER — Encounter: Payer: BLUE CROSS/BLUE SHIELD | Admitting: Gastroenterology

## 2018-12-31 ENCOUNTER — Ambulatory Visit (AMBULATORY_SURGERY_CENTER): Payer: Self-pay | Admitting: *Deleted

## 2018-12-31 VITALS — Ht 66.0 in | Wt 137.0 lb

## 2018-12-31 DIAGNOSIS — Z86010 Personal history of colonic polyps: Secondary | ICD-10-CM

## 2018-12-31 MED ORDER — PEG 3350-KCL-NA BICARB-NACL 420 G PO SOLR: 4000.0000 mL | Freq: Once | ORAL | 0 refills | Status: AC

## 2018-12-31 NOTE — Progress Notes (Signed)
Patient denies any allergies to eggs or soy. Patient denies any problems with anesthesia/sedation. Patient denies any oxygen use at home. Patient denies taking any diet/weight loss medications or blood thinners. EMMI education assisgned to patient on colonoscopy, this was explained and instructions given to patient. 

## 2019-01-07 ENCOUNTER — Encounter: Payer: Self-pay | Admitting: Gastroenterology

## 2019-01-07 ENCOUNTER — Ambulatory Visit (AMBULATORY_SURGERY_CENTER): Payer: Medicare Other | Admitting: Gastroenterology

## 2019-01-07 VITALS — BP 104/64 | HR 50 | Temp 97.5°F | Resp 14 | Ht 66.0 in | Wt 137.0 lb

## 2019-01-07 DIAGNOSIS — D125 Benign neoplasm of sigmoid colon: Secondary | ICD-10-CM

## 2019-01-07 DIAGNOSIS — Z86010 Personal history of colonic polyps: Secondary | ICD-10-CM | POA: Diagnosis present

## 2019-01-07 DIAGNOSIS — D120 Benign neoplasm of cecum: Secondary | ICD-10-CM

## 2019-01-07 DIAGNOSIS — D123 Benign neoplasm of transverse colon: Secondary | ICD-10-CM

## 2019-01-07 DIAGNOSIS — D122 Benign neoplasm of ascending colon: Secondary | ICD-10-CM

## 2019-01-07 DIAGNOSIS — D124 Benign neoplasm of descending colon: Secondary | ICD-10-CM

## 2019-01-07 MED ORDER — SODIUM CHLORIDE 0.9 % IV SOLN: 500.0000 mL | Freq: Once | INTRAVENOUS | Status: DC

## 2019-01-07 NOTE — Op Note (Signed)
[name] Procedure Date: 01/07/2019 9:04 AM MRN: [id number] Endoscopist: Thornton Park MD, MD Age: 68 Referring MD:  Date of Birth: [birth year] Gender: Female Account #: [id number] Procedure:                Colonoscopy Indications:              Surveillance: Personal history of adenomatous                            polyps on last colonoscopy > 5 years ago. Polyp on                            colonoscopy 2007. 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                            Olevia Perches 2013. No baseline GI symptoms. No known                            family history of colon cancer or polyps. Medicines:                See the Anesthesia note for documentation of the                            administered medications Procedure:                Pre-Anesthesia Assessment:                           - Prior to the procedure, a History and Physical                            was performed, and patient medications and                            allergies were reviewed. The patient's tolerance of                            previous anesthesia was also reviewed. The risks                            and benefits of the procedure and the sedation                            options and risks were discussed with the patient.                            All questions were answered, and informed consent                            was obtained. Prior Anticoagulants: The patient has  taken no previous anticoagulant or antiplatelet                            agents. ASA Grade Assessment: II - A patient with                            mild systemic disease. After reviewing the risks                            and benefits, the patient was deemed in                            satisfactory condition to undergo the procedure.                           After obtaining informed  consent, the colonoscope                            was passed under direct vision. Throughout the                            procedure, the patient's blood pressure, pulse, and                            oxygen saturations were monitored continuously. The                            Colonoscope was introduced through the anus and                            advanced to the the cecum, identified by                            appendiceal orifice and ileocecal valve. The                            colonoscopy was performed without difficulty. The                            patient tolerated the procedure well. The quality                            of the bowel preparation was good. Scope In: 9:10:44 AM Scope Out: 9:28:30 AM Scope Withdrawal Time: 0 hours 13 minutes 21 seconds  Total Procedure Duration: 0 hours 17 minutes 46 seconds  Findings:                 Hemorrhoids were found on perianal exam.                           A few small-mouthed diverticula were found in the                            sigmoid colon.  Three sessile polyps were found in the splenic                            flexure, hepatic flexure and ascending colon. The                            polyps were 2 to 3 mm in size. These polyps were                            removed with a cold biopsy forceps. Resection and                            retrieval were complete.                           A 2 mm polyp was found in the descending colon. The                            polyp was sessile. The polyp was removed with a                            cold snare. Resection and retrieval were complete.                            Estimated blood loss was minimal.                           A 12 mm polyp was found in the cecum. The polyp was                            semi-pedunculated. The polyp was removed with a                            cold snare. Resection and retrieval were complete.                             Estimated blood loss was minimal.                           A 11 mm polyp was found in the descending colon.                            The polyp was pedunculated. The polyp was removed                            with a hot snare. Resection and retrieval were                            complete. Estimated blood loss was minimal.                           A 9 mm polyp was found in the sigmoid colon. The  polyp was semi-pedunculated. The polyp was removed                            with a hot snare. Resection and retrieval were                            complete. Estimated blood loss was minimal.                           The exam was otherwise without abnormality on                            direct and retroflexion views. Complications:            No immediate complications. Estimated blood loss:                            Minimal. Estimated Blood Loss:     Estimated blood loss was minimal. Impression:               - Hemorrhoids found on perianal exam.                           - Mild diverticulosis in the sigmoid colon.                           - Three 2 to 3 mm polyps at the splenic flexure, at                            the hepatic flexure and in the ascending colon,                            removed with a cold biopsy forceps. Resected and                            retrieved.                           - One 2 mm polyp in the descending colon, removed                            with a cold snare. Resected and retrieved.                           - One 12 mm polyp in the cecum, removed with a cold                            snare. Resected and retrieved.                           - One 11 mm polyp in the descending colon 46 cm                            from the anal verge, removed with a hot snare.  Resected and retrieved.                           - One 9 mm polyp in the sigmoid colon 26 cm from                             the anal verge, removed with a hot snare. Resected                            and retrieved.                           - The examination was otherwise normal on direct                            and retroflexion views. Recommendation:           - Discharge patient to home.                           - Resume regular diet today.                           - Continue present medications.                           - Await pathology results.                           - Repeat colonoscopy in 3 years for surveillance.                           - First degree family members should start colon                            cancer screening early.                           - Patient has a contact number available for                            emergencies. The signs and symptoms of potential                            delayed complications were discussed with the                            patient. Return to normal activities tomorrow.                            Written discharge instructions were provided to the                            patient. Thornton Park MD, MD 01/07/2019 9:47:47 AM This report has been signed electronically.

## 2019-01-07 NOTE — Patient Instructions (Signed)
YOU HAD AN ENDOSCOPIC PROCEDURE TODAY AT [facility]:   Refer to the procedure report that was given to you for any specific questions about what was found during the examination.  If the procedure report does not answer your questions, please call your gastroenterologist to clarify.  If you requested that your care partner not be given the details of your procedure findings, then the procedure report has been included in a sealed envelope for you to review at your convenience later.  YOU SHOULD EXPECT: Some feelings of bloating in the abdomen. Passage of more gas than usual.  Walking can help get rid of the air that was put into your GI tract during the procedure and reduce the bloating. If you had a lower endoscopy (such as a colonoscopy or flexible sigmoidoscopy) you may notice spotting of blood in your stool or on the toilet paper. If you underwent a bowel prep for your procedure, you may not have a normal bowel movement for a few days.  Please Note:  You might notice some irritation and congestion in your nose or some drainage.  This is from the oxygen used during your procedure.  There is no need for concern and it should clear up in a day or so.  SYMPTOMS TO REPORT IMMEDIATELY:   Following lower endoscopy (colonoscopy or flexible sigmoidoscopy):  Excessive amounts of blood in the stool  Significant tenderness or worsening of abdominal pains  Swelling of the abdomen that is new, acute  Fever of 100F or higher  For urgent or emergent issues, a gastroenterologist can be reached at any hour by calling [phone].   DIET:  We do recommend a small meal at first, but then you may proceed to your regular diet.  Drink plenty of fluids but you should avoid alcoholic beverages for 24 hours.  ACTIVITY:  You should plan to take it easy for the rest of today and you should NOT DRIVE or use heavy machinery until tomorrow (because of the sedation medicines used during the test).     FOLLOW UP: Our staff will call the number listed on your records the next business day following your procedure to check on you and address any questions or concerns that you may have regarding the information given to you following your procedure. If we do not reach you, we will leave a message.  However, if you are feeling well and you are not experiencing any problems, there is no need to return our call.  We will assume that you have returned to your regular daily activities without incident.  If any biopsies were taken you will be contacted by phone or by letter within the next 1-3 weeks.  Please call us at [phone] if you have not heard about the biopsies in 3 weeks.   Await for biopsy results Polyps (handout given) Hemorrhoids (handout given) Diverticulosis (handout given) Repeat screening Colonoscopy in 3 years   SIGNATURES/CONFIDENTIALITY: You and/or your care partner have signed paperwork which will be entered into your electronic medical record.  These signatures attest to the fact that that the information above on your After Visit Summary has been reviewed and is understood.  Full responsibility of the confidentiality of this discharge information lies with you and/or your care-partner.

## 2019-01-07 NOTE — Progress Notes (Signed)
Called to room to assist during endoscopic procedure.  Patient ID and intended procedure confirmed with present staff. Received instructions for my participation in the procedure from the performing physician.  

## 2019-01-07 NOTE — Progress Notes (Signed)
PT taken to PACU. Monitors in place. VSS. Report given to RN. 

## 2019-01-07 NOTE — Progress Notes (Signed)
Pt's states no medical or surgical changes since previsit or office visit. 

## 2019-01-14 ENCOUNTER — Encounter: Payer: Self-pay | Admitting: Gastroenterology

## 2019-05-16 ENCOUNTER — Other Ambulatory Visit: Payer: Self-pay | Admitting: Obstetrics & Gynecology

## 2019-05-16 NOTE — Telephone Encounter (Signed)
Medication refill request: temazepam 15mg  Last AEX:  08-27-18 Next AEX: 12-24-2019 Last MMG (if hormonal medication request): n/a Refill authorized: please approve if appropriate

## 2019-08-20 ENCOUNTER — Other Ambulatory Visit: Payer: Self-pay

## 2019-08-20 DIAGNOSIS — Z20822 Contact with and (suspected) exposure to covid-19: Secondary | ICD-10-CM

## 2019-08-22 LAB — NOVEL CORONAVIRUS, NAA: SARS-CoV-2, NAA: NOT DETECTED

## 2019-09-11 ENCOUNTER — Other Ambulatory Visit: Payer: Self-pay | Admitting: Obstetrics & Gynecology

## 2019-09-11 NOTE — Telephone Encounter (Signed)
Medication refill request: estradiol  Last AEX: 08/27/18  Next AEX: 12/24/19 Last MMG (if hormonal medication request): 09/24/18 bi-rads 1 neg  Refill authorized: #8 with 3 RF to get her to her aex

## 2019-09-30 ENCOUNTER — Encounter: Payer: Self-pay | Admitting: Obstetrics & Gynecology

## 2019-11-18 ENCOUNTER — Other Ambulatory Visit: Payer: Self-pay | Admitting: Obstetrics & Gynecology

## 2019-11-18 NOTE — Telephone Encounter (Signed)
Medication refill request: Restoril  Last AEX:  08-27-18 SM  Next AEX: 12-24-19  Last MMG (if hormonal medication request): n/a Refill authorized: Today, please advise.   Medication pended for #30, 0RF. Please refill if appropriate.

## 2019-12-24 ENCOUNTER — Ambulatory Visit: Payer: Medicare Other | Admitting: Obstetrics & Gynecology

## 2019-12-25 ENCOUNTER — Other Ambulatory Visit: Payer: Self-pay

## 2019-12-26 ENCOUNTER — Ambulatory Visit (INDEPENDENT_AMBULATORY_CARE_PROVIDER_SITE_OTHER): Payer: Medicare Other | Admitting: Obstetrics & Gynecology

## 2019-12-26 ENCOUNTER — Encounter: Payer: Self-pay | Admitting: Obstetrics & Gynecology

## 2019-12-26 VITALS — BP 114/60 | HR 84 | Temp 97.7°F | Resp 12 | Ht 65.5 in | Wt 141.6 lb

## 2019-12-26 DIAGNOSIS — Z124 Encounter for screening for malignant neoplasm of cervix: Secondary | ICD-10-CM

## 2019-12-26 DIAGNOSIS — Z01419 Encounter for gynecological examination (general) (routine) without abnormal findings: Secondary | ICD-10-CM | POA: Diagnosis not present

## 2019-12-26 MED ORDER — ESTRADIOL 10 MCG VA TABS: ORAL_TABLET | VAGINAL | 4 refills | Status: DC

## 2019-12-26 NOTE — Patient Instructions (Addendum)
Plan MMG and BMD in October.  I've sent the order over to [facility] for you.  Call Dr. Baldwin Crown office and tell them that you need the Prevnar vaccine.  (You will need the Pneumovax next year.)

## 2019-12-26 NOTE — Progress Notes (Signed)
69 y.o. G2P2 Married White or Caucasian female here for annual exam.  She and husband both had Covid but had a mild case.  This was in October.  Has a new grandson.  Her son lives in [place].    Patient's last menstrual period was 07/20/2007.          Sexually active: Yes.    The current method of family planning is status post hysterectomy.    Exercising: Yes.    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 She has never used smokeless tobacco. She reports that she does not drink alcohol or use drugs.  Past Medical History:  Diagnosis Date  . Colonic polyp   . COVID-19   . DVT (deep venous thrombosis) ([place]) 2003   after thyroid surgery  . Fibroid   . 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 Hypertension    with pregnancy  . Insomnia   . Thyroid cancer ([place]) 2003    Past Surgical History:  Procedure Laterality Date  . ABDOMINAL HYSTERECTOMY  2009  . cervical polyp resection    . [place]  . COLONOSCOPY  11/30/2012  . POLYPECTOMY    . THYROIDECTOMY  2003    Current Outpatient Medications  Medication Sig Dispense Refill  . Estradiol 10 MCG TABS vaginal tablet INSERT 1 TABLET IN VAGINA TWICE WEEKLY. 8 tablet 3  . Multiple Vitamin (MULTIVITAMIN) tablet Take 1 tablet by mouth daily.    Marland Kitchen SYNTHROID 137 MCG tablet Take 137 mcg by mouth daily.     . temazepam (RESTORIL) 15 MG capsule TAKE 1 CAPSULE AT BEDTIME AS NEEDED FOR SLEEP. 30 capsule 0   Current Facility-Administered Medications  Medication Dose Route Frequency Provider Last Rate Last Admin  . 0.9 %  sodium chloride infusion  500 mL Intravenous Once Thornton Park,  MD        Family History  Problem Relation Age of Onset  . Ovarian cancer Sister   . Diabetes Mother   . Pancreatic cancer Paternal Aunt   . Cancer Maternal Aunt        ? type  . Asthma Son   . Colon cancer Neg Hx   . Stomach cancer Neg Hx   . Colon polyps Neg Hx   . Rectal cancer Neg Hx     Review of Systems  All other systems reviewed and are negative.   Exam:   BP 114/60 (BP Location: Right Arm, Patient Position: Sitting, Cuff Size: Normal)   Pulse 84   Temp 97.7 F (36.5 C) (Temporal)   Resp 12   Ht 5' 5.5" (1.664 m)   Wt 141 lb 9.6 oz (64.2 kg)   LMP 07/20/2007   BMI 23.21 kg/m      Height: 5' 5.5" (166.4 cm)  Ht Readings from Last 3 Encounters:  12/26/19 5' 5.5" (1.664 m)  01/07/19 5\' 6"  (1.676 m)  12/31/18 5\' 6"  (1.676 m)   General appearance: alert, cooperative and appears stated age Head: Normocephalic, without obvious abnormality, atraumatic Neck: no adenopathy, supple, symmetrical, trachea midline and thyroid normal to  inspection and palpation Lungs: clear to auscultation bilaterally Breasts: normal appearance, no masses or tenderness Heart: regular rate and rhythm Abdomen: soft, non-tender; bowel sounds normal; no masses,  no organomegaly Extremities: extremities normal, atraumatic, no cyanosis or edema Skin: Skin color, texture, turgor normal. No rashes or lesions Lymph nodes: Cervical, supraclavicular, and axillary nodes normal. No abnormal inguinal nodes palpated Neurologic: Grossly normal   Pelvic: External genitalia:  no lesions              Urethra:  normal appearing urethra with no masses, tenderness or lesions              Bartholins and Skenes: normal                 Vagina: normal appearing vagina with normal color and discharge, no lesions              Cervix: absent              Pap taken: No. Bimanual Exam:  Uterus:  uterus absent              Adnexa: no mass, fullness, tenderness               Rectovaginal: Confirms                Anus:  normal sphincter tone, no lesions  Chaperone, Terence Lux, CMA, was present for exam.  A:  Well Woman with normal exam PMP, no HRT H/o TAH H/O DVT during treatment or thyroid cancer Chronic insomnia Family hx of ovarian cancer in sister Colon polyps  P:   Mammogram guidelines reviewed.  Doing yearly. pap smear not indicated Colonoscopy UTD.  Repeat due 2023 Does not need RF for Restoril, uses rarely Vagifem 61meq pv twice weekly.  #24/4RF Plan BMD with MMG later this year.  Order will be faxed to [facility]. Vaccinations reviewed.  She is going to wait on having the Shingrix vaccination but will be going to Dr. Baldwin Crown office next week and will call ahead to have prevnar vaccination given. Return annually or prn

## 2020-02-17 ENCOUNTER — Ambulatory Visit: Payer: Medicare Other | Attending: Internal Medicine

## 2020-02-17 DIAGNOSIS — Z23 Encounter for immunization: Secondary | ICD-10-CM | POA: Insufficient documentation

## 2020-02-17 NOTE — Progress Notes (Signed)
   Covid-19 Vaccination Clinic  Name:  April Floyd    MRN: [id number] DOB: [birth year]  02/17/2020  [name] was observed post Covid-19 immunization for 15 minutes without incidence. She was provided with Vaccine Information Sheet and instruction to access the V-Safe system.   [name] was instructed to call 911 with any severe reactions post vaccine: Marland Kitchen Difficulty breathing  . Swelling of your face and throat  . A fast heartbeat  . A bad rash all over your body  . Dizziness and weakness    Immunizations Administered    Name Date Dose VIS Date Route   Pfizer COVID-19 Vaccine 02/17/2020 12:15 PM 0.3 mL 11/29/2019 Intramuscular   Manufacturer: [place]   Lot: [id number]   [place]: [id number]

## 2020-03-13 ENCOUNTER — Ambulatory Visit: Payer: Medicare Other | Admitting: Obstetrics & Gynecology

## 2020-03-17 ENCOUNTER — Ambulatory Visit: Payer: Medicare Other | Attending: Internal Medicine

## 2020-03-17 DIAGNOSIS — Z23 Encounter for immunization: Secondary | ICD-10-CM

## 2020-03-17 NOTE — Progress Notes (Signed)
   Covid-19 Vaccination Clinic  Name:  LEANETTE BOWARD    MRN: [id number] DOB: 1951/02/24  03/17/2020  Ms. Shorey was observed post Covid-19 immunization for 15 minutes without incident. She was provided with Vaccine Information Sheet and instruction to access the V-Safe system.   Ms. Manspeaker was instructed to call 911 with any severe reactions post vaccine: Marland Kitchen Difficulty breathing  . Swelling of face and throat  . A fast heartbeat  . A bad rash all over body  . Dizziness and weakness   Immunizations Administered    Name Date Dose VIS Date Route   Pfizer COVID-19 Vaccine 03/17/2020 12:45 PM 0.3 mL 11/29/2019 Intramuscular   Manufacturer: [place]   Lot: [id number]   [place]: [id number]

## 2020-09-02 ENCOUNTER — Other Ambulatory Visit: Payer: Self-pay | Admitting: Obstetrics & Gynecology

## 2020-09-02 DIAGNOSIS — G4700 Insomnia, unspecified: Secondary | ICD-10-CM

## 2020-09-02 NOTE — Telephone Encounter (Signed)
Medication refill request: Temazepam 15mg  Last AEX:  12/26/19 Next AEX: 03/12/21 Last MMG (if hormonal medication request): 09/30/19 Neg  Refill authorized: 30/0

## 2020-10-08 ENCOUNTER — Ambulatory Visit: Payer: Self-pay | Attending: Internal Medicine

## 2020-10-08 DIAGNOSIS — Z23 Encounter for immunization: Secondary | ICD-10-CM

## 2020-10-08 NOTE — Progress Notes (Signed)
   Covid-19 Vaccination Clinic  Name:  April Floyd    MRN: [id number] DOB: [birth year]  10/08/2020  Ms. April Floyd was observed post Covid-19 immunization for 15 minutes without incident. She was provided with Vaccine Information Sheet and instruction to access the V-Safe system.   Ms. April Floyd was instructed to call 911 with any severe reactions post vaccine: Marland Kitchen Difficulty breathing  . Swelling of face and throat  . A fast heartbeat  . A bad rash all over body  . Dizziness and weakness

## 2020-10-13 ENCOUNTER — Encounter: Payer: Self-pay | Admitting: Obstetrics & Gynecology

## 2020-12-19 HISTORY — PX: TOTAL HIP ARTHROPLASTY: SHX124

## 2021-01-11 DIAGNOSIS — M25552 Pain in left hip: Secondary | ICD-10-CM | POA: Diagnosis not present

## 2021-01-20 DIAGNOSIS — M25552 Pain in left hip: Secondary | ICD-10-CM | POA: Diagnosis not present

## 2021-02-01 DIAGNOSIS — M25552 Pain in left hip: Secondary | ICD-10-CM | POA: Diagnosis not present

## 2021-02-09 ENCOUNTER — Encounter: Payer: Self-pay | Admitting: Obstetrics & Gynecology

## 2021-02-15 DIAGNOSIS — M25552 Pain in left hip: Secondary | ICD-10-CM | POA: Diagnosis not present

## 2021-02-22 DIAGNOSIS — M25552 Pain in left hip: Secondary | ICD-10-CM | POA: Diagnosis not present

## 2021-02-24 ENCOUNTER — Other Ambulatory Visit: Payer: Self-pay | Admitting: Obstetrics & Gynecology

## 2021-02-24 DIAGNOSIS — G4700 Insomnia, unspecified: Secondary | ICD-10-CM

## 2021-03-01 DIAGNOSIS — M25552 Pain in left hip: Secondary | ICD-10-CM | POA: Diagnosis not present

## 2021-03-08 DIAGNOSIS — M25552 Pain in left hip: Secondary | ICD-10-CM | POA: Diagnosis not present

## 2021-03-12 ENCOUNTER — Ambulatory Visit: Payer: Medicare Other

## 2021-03-22 DIAGNOSIS — M545 Low back pain, unspecified: Secondary | ICD-10-CM | POA: Diagnosis not present

## 2021-03-22 DIAGNOSIS — M25552 Pain in left hip: Secondary | ICD-10-CM | POA: Diagnosis not present

## 2021-03-22 DIAGNOSIS — M25551 Pain in right hip: Secondary | ICD-10-CM | POA: Diagnosis not present

## 2021-03-23 ENCOUNTER — Ambulatory Visit (HOSPITAL_BASED_OUTPATIENT_CLINIC_OR_DEPARTMENT_OTHER): Payer: Medicare Other | Admitting: Obstetrics & Gynecology

## 2021-03-26 DIAGNOSIS — M25552 Pain in left hip: Secondary | ICD-10-CM | POA: Diagnosis not present

## 2021-03-30 ENCOUNTER — Ambulatory Visit (HOSPITAL_BASED_OUTPATIENT_CLINIC_OR_DEPARTMENT_OTHER): Payer: Medicare Other | Admitting: Obstetrics & Gynecology

## 2021-04-14 DIAGNOSIS — M25552 Pain in left hip: Secondary | ICD-10-CM | POA: Diagnosis not present

## 2021-04-22 ENCOUNTER — Encounter (HOSPITAL_BASED_OUTPATIENT_CLINIC_OR_DEPARTMENT_OTHER): Payer: Self-pay | Admitting: Obstetrics & Gynecology

## 2021-04-22 ENCOUNTER — Ambulatory Visit (INDEPENDENT_AMBULATORY_CARE_PROVIDER_SITE_OTHER): Payer: Medicare Other | Admitting: Obstetrics & Gynecology

## 2021-04-22 ENCOUNTER — Other Ambulatory Visit: Payer: Self-pay

## 2021-04-22 VITALS — BP 127/76 | HR 60 | Ht 65.5 in | Wt 143.6 lb

## 2021-04-22 DIAGNOSIS — C73 Malignant neoplasm of thyroid gland: Secondary | ICD-10-CM

## 2021-04-22 DIAGNOSIS — G4700 Insomnia, unspecified: Secondary | ICD-10-CM

## 2021-04-22 DIAGNOSIS — Z8041 Family history of malignant neoplasm of ovary: Secondary | ICD-10-CM

## 2021-04-22 DIAGNOSIS — Z86718 Personal history of other venous thrombosis and embolism: Secondary | ICD-10-CM

## 2021-04-22 DIAGNOSIS — Z86010 Personal history of colonic polyps: Secondary | ICD-10-CM

## 2021-04-22 DIAGNOSIS — Z01419 Encounter for gynecological examination (general) (routine) without abnormal findings: Secondary | ICD-10-CM | POA: Diagnosis not present

## 2021-04-22 NOTE — Progress Notes (Signed)
70 y.o. G2P0000 Divorced White or Caucasian female here for breast and pelvic exam. Denies vaginal bleeding.  Having some left hip issues.  Has seen ortho.  Doing physical therapy but hip replacement has been recommended.    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  Denies vaginal bleeding.  Not using vaginal estrogen.    Patient's last menstrual period was 07/20/2007.          Sexually active: Yes.    H/O STD:  no  Health Maintenance: PCP:  Dr. Forde Dandy.  Last wellness appt was fall, 2021.  Did blood work at that appt:  yes Vaccines are up to date:  I do not have pneumonia or tdap dates Colonoscopy:  2020, follow up 3 years MMG:  10/2020 BMD:  2015 Last pap smear:  Prior to hysterectomy H/o abnormal pap smear: not   reports that she has never smoked. She has never used smokeless tobacco. She reports that she does not drink alcohol and does not use drugs.  Past Medical History:  Diagnosis Date  . Colonic polyp   . COVID-19   . DVT (deep venous thrombosis) ([place]) 2003   after thyroid surgery  . Fibroid   . 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 Hypertension    with pregnancy  . Insomnia   . Thyroid cancer ([place]) 2003    Past Surgical History:  Procedure Laterality Date  . ABDOMINAL HYSTERECTOMY  2009  . cervical polyp resection    . [place]  . COLONOSCOPY  11/30/2012  . POLYPECTOMY    . THYROIDECTOMY  2003    Current Outpatient Medications  Medication Sig Dispense Refill  . meloxicam (MOBIC) 15 MG tablet Take 1 tablet by mouth daily.    . Multiple Vitamin (MULTIVITAMIN) tablet Take 1 tablet by mouth daily.    Marland Kitchen SYNTHROID 137 MCG tablet Take 137 mcg by mouth daily.     . temazepam (RESTORIL) 15 MG capsule TAKE 1 CAPSULE AT BEDTIME AS NEEDED FOR SLEEP. 30 capsule 0   Current Facility-Administered Medications  Medication Dose Route Frequency Provider Last Rate Last Admin  . 0.9 %  sodium chloride infusion  500  mL Intravenous Once Thornton Park, MD        Family History  Problem Relation Age of Onset  . Ovarian cancer Sister   . Diabetes Mother   . Pancreatic cancer Paternal Aunt   . Cancer Maternal Aunt        ? type  . Asthma Son   . Colon cancer Neg Hx   . Stomach cancer Neg Hx   . Colon polyps Neg Hx   . Rectal cancer Neg Hx     Review of Systems  All other systems reviewed and are negative.   Exam:   BP 127/76 (BP Location: Right Arm, Patient Position: Sitting, Cuff Size: Small)   Pulse 60   Ht 5' 5.5" (1.664 m)   Wt 143 lb 9.6 oz (65.1 kg)   LMP 07/20/2007   BMI 23.53 kg/m   Height: 5' 5.5" (166.4 cm)  General appearance: alert, cooperative and appears stated age Breasts: normal appearance, no masses or tenderness Abdomen: soft, non-tender; bowel sounds normal; no masses,  no organomegaly Lymph nodes: Cervical, supraclavicular, and axillary nodes normal.  No abnormal inguinal nodes palpated Neurologic: Grossly normal  Pelvic: External genitalia:  no lesions  Urethra:  normal appearing urethra with no masses, tenderness or lesions              Bartholins and Skenes: normal                 Vagina: normal appearing vagina with atrophic changes and no discharge, no lesions              Cervix: absent              Pap taken: No. Bimanual Exam:  Uterus:  uterus absent              Adnexa: no mass, fullness, tenderness               Rectovaginal: Confirms               Anus:  normal sphincter tone, no lesions  Chaperone, Octaviano Batty, CMA, was present for exam.  Assessment/Plan: 1. Encounter for gynecological examination without abnormal finding - Pap smear not indicated - MMG 10/2020 - Colonoscopy 2020, follow up 3 years - vaccines reviewed - blood work done with Dr. Forde Dandy - pt going to consider returning in 2 years  2. Insomnia, unspecified type - use temazepam prn  3. History of DVT (deep vein thrombosis)  4. THYROID CANCER  5. Family  history of ovarian cancer  6. History of colonic polyps  Total time with pt/visit:  29 minutes

## 2021-04-29 DIAGNOSIS — M25552 Pain in left hip: Secondary | ICD-10-CM | POA: Diagnosis not present

## 2021-05-07 DIAGNOSIS — M25552 Pain in left hip: Secondary | ICD-10-CM | POA: Diagnosis not present

## 2021-07-15 DIAGNOSIS — D485 Neoplasm of uncertain behavior of skin: Secondary | ICD-10-CM | POA: Diagnosis not present

## 2021-07-15 DIAGNOSIS — D224 Melanocytic nevi of scalp and neck: Secondary | ICD-10-CM | POA: Diagnosis not present

## 2021-07-15 DIAGNOSIS — D2272 Melanocytic nevi of left lower limb, including hip: Secondary | ICD-10-CM | POA: Diagnosis not present

## 2021-08-24 DIAGNOSIS — M25552 Pain in left hip: Secondary | ICD-10-CM | POA: Diagnosis not present

## 2021-08-24 DIAGNOSIS — M7062 Trochanteric bursitis, left hip: Secondary | ICD-10-CM | POA: Diagnosis not present

## 2021-10-09 DIAGNOSIS — Z23 Encounter for immunization: Secondary | ICD-10-CM | POA: Diagnosis not present

## 2021-10-12 DIAGNOSIS — M1612 Unilateral primary osteoarthritis, left hip: Secondary | ICD-10-CM | POA: Diagnosis not present

## 2021-10-12 DIAGNOSIS — R7989 Other specified abnormal findings of blood chemistry: Secondary | ICD-10-CM | POA: Diagnosis not present

## 2021-10-12 DIAGNOSIS — D649 Anemia, unspecified: Secondary | ICD-10-CM | POA: Diagnosis not present

## 2021-10-12 DIAGNOSIS — I8290 Acute embolism and thrombosis of unspecified vein: Secondary | ICD-10-CM | POA: Diagnosis not present

## 2021-10-12 DIAGNOSIS — E890 Postprocedural hypothyroidism: Secondary | ICD-10-CM | POA: Diagnosis not present

## 2021-10-14 DIAGNOSIS — Z1231 Encounter for screening mammogram for malignant neoplasm of breast: Secondary | ICD-10-CM | POA: Diagnosis not present

## 2021-10-18 ENCOUNTER — Encounter (HOSPITAL_BASED_OUTPATIENT_CLINIC_OR_DEPARTMENT_OTHER): Payer: Self-pay | Admitting: *Deleted

## 2021-10-26 DIAGNOSIS — M1612 Unilateral primary osteoarthritis, left hip: Secondary | ICD-10-CM | POA: Diagnosis not present

## 2021-10-26 DIAGNOSIS — M1712 Unilateral primary osteoarthritis, left knee: Secondary | ICD-10-CM | POA: Diagnosis not present

## 2021-11-01 DIAGNOSIS — D126 Benign neoplasm of colon, unspecified: Secondary | ICD-10-CM | POA: Diagnosis not present

## 2021-11-01 DIAGNOSIS — E890 Postprocedural hypothyroidism: Secondary | ICD-10-CM | POA: Diagnosis not present

## 2021-11-01 DIAGNOSIS — C73 Malignant neoplasm of thyroid gland: Secondary | ICD-10-CM | POA: Diagnosis not present

## 2021-11-01 DIAGNOSIS — M8580 Other specified disorders of bone density and structure, unspecified site: Secondary | ICD-10-CM | POA: Diagnosis not present

## 2021-11-03 DIAGNOSIS — Z96642 Presence of left artificial hip joint: Secondary | ICD-10-CM | POA: Diagnosis not present

## 2021-11-03 DIAGNOSIS — M1612 Unilateral primary osteoarthritis, left hip: Secondary | ICD-10-CM | POA: Diagnosis not present

## 2021-11-08 DIAGNOSIS — R531 Weakness: Secondary | ICD-10-CM | POA: Diagnosis not present

## 2021-11-08 DIAGNOSIS — M25552 Pain in left hip: Secondary | ICD-10-CM | POA: Diagnosis not present

## 2021-11-08 DIAGNOSIS — R262 Difficulty in walking, not elsewhere classified: Secondary | ICD-10-CM | POA: Diagnosis not present

## 2021-11-08 DIAGNOSIS — Z96642 Presence of left artificial hip joint: Secondary | ICD-10-CM | POA: Diagnosis not present

## 2021-11-10 DIAGNOSIS — M1612 Unilateral primary osteoarthritis, left hip: Secondary | ICD-10-CM | POA: Diagnosis not present

## 2021-11-10 DIAGNOSIS — R519 Headache, unspecified: Secondary | ICD-10-CM | POA: Diagnosis not present

## 2021-11-10 DIAGNOSIS — R110 Nausea: Secondary | ICD-10-CM | POA: Diagnosis not present

## 2021-11-10 DIAGNOSIS — Z96642 Presence of left artificial hip joint: Secondary | ICD-10-CM | POA: Diagnosis not present

## 2021-11-16 DIAGNOSIS — R531 Weakness: Secondary | ICD-10-CM | POA: Diagnosis not present

## 2021-11-16 DIAGNOSIS — Z96642 Presence of left artificial hip joint: Secondary | ICD-10-CM | POA: Diagnosis not present

## 2021-11-16 DIAGNOSIS — M25552 Pain in left hip: Secondary | ICD-10-CM | POA: Diagnosis not present

## 2021-11-16 DIAGNOSIS — M1612 Unilateral primary osteoarthritis, left hip: Secondary | ICD-10-CM | POA: Diagnosis not present

## 2021-11-16 DIAGNOSIS — R262 Difficulty in walking, not elsewhere classified: Secondary | ICD-10-CM | POA: Diagnosis not present

## 2021-11-19 DIAGNOSIS — M25552 Pain in left hip: Secondary | ICD-10-CM | POA: Diagnosis not present

## 2021-11-19 DIAGNOSIS — R262 Difficulty in walking, not elsewhere classified: Secondary | ICD-10-CM | POA: Diagnosis not present

## 2021-11-19 DIAGNOSIS — R531 Weakness: Secondary | ICD-10-CM | POA: Diagnosis not present

## 2021-11-19 DIAGNOSIS — Z96642 Presence of left artificial hip joint: Secondary | ICD-10-CM | POA: Diagnosis not present

## 2021-11-24 DIAGNOSIS — Z96642 Presence of left artificial hip joint: Secondary | ICD-10-CM | POA: Diagnosis not present

## 2021-11-24 DIAGNOSIS — R531 Weakness: Secondary | ICD-10-CM | POA: Diagnosis not present

## 2021-11-24 DIAGNOSIS — M25552 Pain in left hip: Secondary | ICD-10-CM | POA: Diagnosis not present

## 2021-11-24 DIAGNOSIS — R262 Difficulty in walking, not elsewhere classified: Secondary | ICD-10-CM | POA: Diagnosis not present

## 2021-11-26 DIAGNOSIS — Z96642 Presence of left artificial hip joint: Secondary | ICD-10-CM | POA: Diagnosis not present

## 2021-11-26 DIAGNOSIS — M25552 Pain in left hip: Secondary | ICD-10-CM | POA: Diagnosis not present

## 2021-11-26 DIAGNOSIS — R531 Weakness: Secondary | ICD-10-CM | POA: Diagnosis not present

## 2021-11-26 DIAGNOSIS — R262 Difficulty in walking, not elsewhere classified: Secondary | ICD-10-CM | POA: Diagnosis not present

## 2021-11-29 DIAGNOSIS — R262 Difficulty in walking, not elsewhere classified: Secondary | ICD-10-CM | POA: Diagnosis not present

## 2021-11-29 DIAGNOSIS — M25552 Pain in left hip: Secondary | ICD-10-CM | POA: Diagnosis not present

## 2021-11-29 DIAGNOSIS — R531 Weakness: Secondary | ICD-10-CM | POA: Diagnosis not present

## 2021-11-29 DIAGNOSIS — Z96642 Presence of left artificial hip joint: Secondary | ICD-10-CM | POA: Diagnosis not present

## 2021-12-03 DIAGNOSIS — M25552 Pain in left hip: Secondary | ICD-10-CM | POA: Diagnosis not present

## 2021-12-03 DIAGNOSIS — R262 Difficulty in walking, not elsewhere classified: Secondary | ICD-10-CM | POA: Diagnosis not present

## 2021-12-03 DIAGNOSIS — Z96642 Presence of left artificial hip joint: Secondary | ICD-10-CM | POA: Diagnosis not present

## 2021-12-03 DIAGNOSIS — R531 Weakness: Secondary | ICD-10-CM | POA: Diagnosis not present

## 2021-12-06 DIAGNOSIS — Z96642 Presence of left artificial hip joint: Secondary | ICD-10-CM | POA: Diagnosis not present

## 2021-12-06 DIAGNOSIS — R262 Difficulty in walking, not elsewhere classified: Secondary | ICD-10-CM | POA: Diagnosis not present

## 2021-12-06 DIAGNOSIS — M25552 Pain in left hip: Secondary | ICD-10-CM | POA: Diagnosis not present

## 2021-12-06 DIAGNOSIS — R531 Weakness: Secondary | ICD-10-CM | POA: Diagnosis not present

## 2021-12-09 DIAGNOSIS — R262 Difficulty in walking, not elsewhere classified: Secondary | ICD-10-CM | POA: Diagnosis not present

## 2021-12-09 DIAGNOSIS — Z96642 Presence of left artificial hip joint: Secondary | ICD-10-CM | POA: Diagnosis not present

## 2021-12-09 DIAGNOSIS — M25552 Pain in left hip: Secondary | ICD-10-CM | POA: Diagnosis not present

## 2021-12-09 DIAGNOSIS — R531 Weakness: Secondary | ICD-10-CM | POA: Diagnosis not present

## 2021-12-22 DIAGNOSIS — R262 Difficulty in walking, not elsewhere classified: Secondary | ICD-10-CM | POA: Diagnosis not present

## 2021-12-22 DIAGNOSIS — M25552 Pain in left hip: Secondary | ICD-10-CM | POA: Diagnosis not present

## 2021-12-22 DIAGNOSIS — Z96642 Presence of left artificial hip joint: Secondary | ICD-10-CM | POA: Diagnosis not present

## 2021-12-22 DIAGNOSIS — R531 Weakness: Secondary | ICD-10-CM | POA: Diagnosis not present

## 2021-12-25 ENCOUNTER — Other Ambulatory Visit: Payer: Self-pay | Admitting: Obstetrics & Gynecology

## 2021-12-25 DIAGNOSIS — G4700 Insomnia, unspecified: Secondary | ICD-10-CM

## 2022-02-11 DIAGNOSIS — M25552 Pain in left hip: Secondary | ICD-10-CM | POA: Diagnosis not present

## 2022-02-11 DIAGNOSIS — Z96642 Presence of left artificial hip joint: Secondary | ICD-10-CM | POA: Diagnosis not present

## 2022-02-11 DIAGNOSIS — R262 Difficulty in walking, not elsewhere classified: Secondary | ICD-10-CM | POA: Diagnosis not present

## 2022-02-11 DIAGNOSIS — R531 Weakness: Secondary | ICD-10-CM | POA: Diagnosis not present

## 2022-02-14 ENCOUNTER — Encounter: Payer: Self-pay | Admitting: Gastroenterology

## 2022-03-02 DIAGNOSIS — R262 Difficulty in walking, not elsewhere classified: Secondary | ICD-10-CM | POA: Diagnosis not present

## 2022-03-02 DIAGNOSIS — R531 Weakness: Secondary | ICD-10-CM | POA: Diagnosis not present

## 2022-03-02 DIAGNOSIS — Z96642 Presence of left artificial hip joint: Secondary | ICD-10-CM | POA: Diagnosis not present

## 2022-03-02 DIAGNOSIS — M25552 Pain in left hip: Secondary | ICD-10-CM | POA: Diagnosis not present

## 2022-03-23 ENCOUNTER — Encounter: Payer: Self-pay | Admitting: Gastroenterology

## 2022-04-28 DIAGNOSIS — Z96642 Presence of left artificial hip joint: Secondary | ICD-10-CM | POA: Diagnosis not present

## 2022-05-17 ENCOUNTER — Ambulatory Visit (AMBULATORY_SURGERY_CENTER): Payer: Medicare Other | Admitting: *Deleted

## 2022-05-17 VITALS — Ht 66.0 in | Wt 135.0 lb

## 2022-05-17 DIAGNOSIS — Z86010 Personal history of colonic polyps: Secondary | ICD-10-CM

## 2022-05-17 NOTE — Progress Notes (Signed)
No egg or soy allergy known to patient  ?No issues known to pt with past sedation with any surgeries or procedures ?Patient denies ever being told they had issues or difficulty with intubation  ?No FH of Malignant Hyperthermia ?Pt is not on diet pills ?Pt is not on  home 02  ?Pt is not on blood thinners  ?Pt denies issues with constipation  ?No A fib or A flutter ? ? ?PV completed over the phone. Pt verified name, DOB, address and insurance during PV today.  ?Pt mailed instruction packet with copy of consent form to read and not return, and instructions.  ?Pt encouraged to call with questions or issues.  ?If pt has My chart, procedure instructions sent via My Chart  ?Insurance confirmed with pt at PV today   ?

## 2022-06-07 ENCOUNTER — Encounter: Payer: Medicare Other | Admitting: Gastroenterology

## 2022-06-13 ENCOUNTER — Encounter: Payer: Medicare Other | Admitting: Gastroenterology

## 2022-07-07 ENCOUNTER — Encounter: Payer: Self-pay | Admitting: Gastroenterology

## 2022-07-14 ENCOUNTER — Other Ambulatory Visit: Payer: Self-pay

## 2022-07-14 ENCOUNTER — Encounter: Payer: Self-pay | Admitting: Gastroenterology

## 2022-07-14 ENCOUNTER — Ambulatory Visit (AMBULATORY_SURGERY_CENTER): Payer: Medicare Other | Admitting: Gastroenterology

## 2022-07-14 ENCOUNTER — Telehealth: Payer: Self-pay

## 2022-07-14 VITALS — BP 107/65 | HR 70 | Temp 97.7°F | Resp 17 | Ht 65.5 in | Wt 135.0 lb

## 2022-07-14 DIAGNOSIS — D122 Benign neoplasm of ascending colon: Secondary | ICD-10-CM | POA: Diagnosis not present

## 2022-07-14 DIAGNOSIS — D125 Benign neoplasm of sigmoid colon: Secondary | ICD-10-CM

## 2022-07-14 DIAGNOSIS — D123 Benign neoplasm of transverse colon: Secondary | ICD-10-CM

## 2022-07-14 DIAGNOSIS — K635 Polyp of colon: Secondary | ICD-10-CM | POA: Diagnosis not present

## 2022-07-14 DIAGNOSIS — Z86010 Personal history of colonic polyps: Secondary | ICD-10-CM

## 2022-07-14 DIAGNOSIS — Z09 Encounter for follow-up examination after completed treatment for conditions other than malignant neoplasm: Secondary | ICD-10-CM | POA: Diagnosis not present

## 2022-07-14 MED ORDER — SODIUM CHLORIDE 0.9 % IV SOLN: 500.0000 mL | Freq: Once | INTRAVENOUS | Status: DC

## 2022-07-14 NOTE — Telephone Encounter (Signed)
Referral placed.

## 2022-07-14 NOTE — Progress Notes (Signed)
   Referring Provider: Reynold Bowen, MD Primary Care Physician:  Reynold Bowen, MD  Indication for Procedure:  Colon cancer Surveillance   IMPRESSION:  Need for colon cancer surveillance Appropriate candidate for monitored anesthesia care  PLAN: Colonoscopy in the [facility] today   HPI: April Floyd is a 71 y.o. female presents for surveillance colonoscopy.  Prior endoscopic history: - Polyp on colonoscopy 2007. Path unknown. 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 Olevia Perches 2013. - Colonoscopy 01/07/19: 7 tubular adenomas, hemorrhoids, sigmoid diverticulosis  Surveillance colonoscopy recommended in 3 years.  No known family history of colon cancer or polyps.     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      Multiple Vitamin (MULTIVITAMIN) tablet Take 1 tablet by mouth daily.     temazepam (RESTORIL) 15 MG capsule TAKE 1 CAPSULE AT BEDTIME AS NEEDED FOR SLEEP. 30 capsule 0   Current Facility-Administered Medications  Medication Dose Route Frequency Provider Last Rate Last Admin   0.9 %  sodium chloride infusion  500 mL Intravenous Once Thornton Park, MD        Allergies as of 07/14/2022 - Review Complete 05/17/2022  Allergen Reaction Noted   No known allergies  07/14/2022    Family History  Problem Relation Age of Onset   Diabetes Mother     Ovarian cancer Sister    Cancer Maternal Aunt        ? type   Pancreatic cancer Paternal Aunt    Asthma Son    Colon cancer Neg Hx    Stomach cancer Neg Hx    Colon polyps Neg Hx    Rectal cancer Neg Hx    Esophageal cancer Neg Hx      Physical Exam: General:   Alert,  well-nourished, pleasant and cooperative in NAD Head:  Normocephalic and atraumatic. Eyes:  Sclera clear, no icterus.   Conjunctiva pink. Mouth:  No deformity or lesions.   Neck:  Supple; no masses or thyromegaly. Lungs:  Clear throughout to auscultation.   No wheezes. Heart:  Regular rate and rhythm; no murmurs. Abdomen:  Soft, non-tender, nondistended, normal bowel sounds, no rebound or guarding.  Msk:  Symmetrical. No boney deformities LAD: No inguinal or umbilical LAD Extremities:  No clubbing or edema. Neurologic:  Alert and  oriented x4;  grossly nonfocal Skin:  No obvious rash or bruise. Psych:  Alert and cooperative. Normal mood and affect.     Studies/Results: No results found.    Berdell Hostetler L. Tarri Glenn, MD, MPH 07/14/2022, 10:46 AM

## 2022-07-14 NOTE — Progress Notes (Signed)
PT taken to PACU. Monitors in place. VSS. Report given to RN. 

## 2022-07-14 NOTE — Op Note (Signed)
[facility] Patient Name: April Floyd Procedure Date: 07/14/2022 11:12 AM MRN: [id number] Endoscopist: Thornton Park MD, MD Age: 71 Referring MD:  Date of Birth: 1951-08-14 Gender: Female Account #: [id number] Procedure:                Colonoscopy Indications:              High risk colon cancer surveillance: Personal                            history of multiple (3 or more) adenomas                           - Polyp on colonoscopy 2007. Path unknown.                           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 Olevia Perches 2013.                           - Colonoscopy 01/07/19: 7 tubular adenomas,                            hemorrhoids, sigmoid diverticulosis Medicines:                Monitored Anesthesia Care Procedure:                Pre-Anesthesia Assessment:                           - Prior to the procedure, a History and Physical                            was performed, and patient medications and                            allergies were reviewed. The patient's tolerance of                            previous anesthesia was also reviewed. The risks                            and benefits of the procedure and the sedation                            options and risks were discussed with the patient.                            All questions were answered, and informed consent                            was obtained. Prior Anticoagulants: The patient has  taken no previous anticoagulant or antiplatelet                            agents. ASA Grade Assessment: III - A patient with                            severe systemic disease. After reviewing the risks                            and benefits, the patient was deemed in                            satisfactory condition to undergo the procedure.                           After obtaining informed consent, the colonoscope                             was passed under direct vision. Throughout the                            procedure, the patient's blood pressure, pulse, and                            oxygen saturations were monitored continuously. The                            CF [id number] #[id number] was introduced through the anus                            and advanced to the 3 cm into the ileum. A second                            forward view of the right colon was performed. The                            colonoscopy was performed without difficulty. The                            patient tolerated the procedure well. The quality                            of the bowel preparation was good. The terminal                            ileum, ileocecal valve, appendiceal orifice, and                            rectum were photographed. Scope In: 11:23:24 AM Scope Out: 11:52:20 AM Scope Withdrawal Time: 0 hours 24 minutes 42 seconds  Total Procedure Duration: 0 hours 28 minutes 56 seconds  Findings:                 Non-bleeding external and internal  hemorrhoids were                            found.                           Multiple small and large-mouthed diverticula were                            found in the sigmoid colon and descending colon.                           A 2 mm polyp was found in the sigmoid colon. The                            polyp was sessile. The polyp was removed with a                            cold snare. Resection and retrieval were complete.                            Estimated blood loss was minimal.                           A 1 mm polyp was found in the distal transverse                            colon. The polyp was flat. The polyp was removed                            with a cold snare. Resection and retrieval were                            complete. Estimated blood loss was minimal.                           A 3 mm polyp was found in the proximal transverse                             colon. The polyp was flat. The polyp was removed                            with a cold snare. Resection and retrieval were                            complete. Estimated blood loss was minimal.                           A 6 mm polyp was found in the hepatic flexure. It                            initially looked like a colonic fold. The polyp was  flat. The polyp was removed with a cold snare.                            Resection and retrieval were complete. Estimated                            blood loss was minimal.                           A less than 1 mm polyp was found in the distal                            ascending colon. The polyp was sessile. The polyp                            was removed with a cold snare. Resection and                            retrieval were complete. Estimated blood loss was                            minimal.                           A 8 mm polyp was found in the mid ascending colon.                            The polyp was flat and located behind a fold. The                            polyp was removed with a cold snare. Resection and                            retrieval were complete. Estimated blood loss was                            minimal.                           A 1 mm polyp was found in the proximal ascending                            colon. The polyp was flat. The polyp was removed                            with a cold snare. Resection and retrieval were                            complete. Estimated blood loss was minimal.                           The exam was otherwise without abnormality on  direct and retroflexion views. Complications:            No immediate complications. Estimated Blood Loss:     Estimated blood loss was minimal. Impression:               - Non-bleeding external and internal hemorrhoids.                           - Diverticulosis in the sigmoid colon and in  the                            descending colon.                           - One 2 mm polyp in the sigmoid colon, removed with                            a cold snare. Resected and retrieved.                           - One 1 mm polyp in the distal transverse colon,                            removed with a cold snare. Resected and retrieved.                           - One 3 mm polyp in the proximal transverse colon,                            removed with a cold snare. Resected and retrieved.                           - One 6 mm polyp at the hepatic flexure, removed                            with a cold snare. Resected and retrieved.                           - One less than 1 mm polyp in the distal ascending                            colon, removed with a cold snare. Resected and                            retrieved.                           - One 8 mm polyp in the mid ascending colon,                            removed with a cold snare. Resected and retrieved.                           - One 1 mm  polyp in the proximal ascending colon,                            removed with a cold snare. Resected and retrieved.                           - The examination was otherwise normal on direct                            and retroflexion views. Recommendation:           - Patient has a contact number available for                            emergencies. The signs and symptoms of potential                            delayed complications were discussed with the                            patient. Return to normal activities tomorrow.                            Written discharge instructions were provided to the                            patient.                           - Resume previous diet.                           - Continue present medications.                           - Await pathology results.                           - Repeat colonoscopy in 3 years for surveillance if                             at least 3 polyps are adenomas.                           - Consider genetic testing given the total number                            of adenomas.                           - Emerging evidence supports eating a diet of                            fruits, vegetables, grains, calcium, and yogurt  while reducing red meat and alcohol may reduce the                            risk of colon cancer. Thornton Park MD, MD 07/14/2022 12:03:29 PM This report has been signed electronically.

## 2022-07-14 NOTE — Progress Notes (Signed)
Called to room to assist during endoscopic procedure.  Patient ID and intended procedure confirmed with present staff. Received instructions for my participation in the procedure from the performing physician.  

## 2022-07-14 NOTE — Telephone Encounter (Signed)
-----   Message from Thornton Park, MD sent at 07/14/2022 12:03 PM EDT ----- Referral for genetic counseling - >10 tubular adenomas  Thank you.  KLB

## 2022-07-14 NOTE — Progress Notes (Signed)
Vitals-AS  Pt's states no medical or surgical changes since previsit or office visit.  

## 2022-07-14 NOTE — Patient Instructions (Signed)
Handouts Provided:  Diverticulosis and Polyps  YOU HAD AN ENDOSCOPIC PROCEDURE TODAY AT [facility]:   Refer to the procedure report that was given to you for any specific questions about what was found during the examination.  If the procedure report does not answer your questions, please call your gastroenterologist to clarify.  If you requested that your care partner not be given the details of your procedure findings, then the procedure report has been included in a sealed envelope for you to review at your convenience later.  YOU SHOULD EXPECT: Some feelings of bloating in the abdomen. Passage of more gas than usual.  Walking can help get rid of the air that was put into your GI tract during the procedure and reduce the bloating. If you had a lower endoscopy (such as a colonoscopy or flexible sigmoidoscopy) you may notice spotting of blood in your stool or on the toilet paper. If you underwent a bowel prep for your procedure, you may not have a normal bowel movement for a few days.  Please Note:  You might notice some irritation and congestion in your nose or some drainage.  This is from the oxygen used during your procedure.  There is no need for concern and it should clear up in a day or so.  SYMPTOMS TO REPORT IMMEDIATELY:  Following lower endoscopy (colonoscopy or flexible sigmoidoscopy):  Excessive amounts of blood in the stool  Significant tenderness or worsening of abdominal pains  Swelling of the abdomen that is new, acute  Fever of 100F or higher  For urgent or emergent issues, a gastroenterologist can be reached at any hour by calling [phone]. Do not use MyChart messaging for urgent concerns.    DIET:  We do recommend a small meal at first, but then you may proceed to your regular diet.  Drink plenty of fluids but you should avoid alcoholic beverages for 24 hours.  ACTIVITY:  You should plan to take it easy for the rest of today and you should NOT DRIVE  or use heavy machinery until tomorrow (because of the sedation medicines used during the test).    FOLLOW UP: Our staff will call the number listed on your records the next business day following your procedure.  We will call around 7:15- 8:00 am to check on you and address any questions or concerns that you may have regarding the information given to you following your procedure. If we do not reach you, we will leave a message.  If you develop any symptoms (ie: fever, flu-like symptoms, shortness of breath, cough etc.) before then, please call [phone].  If you test positive for Covid 19 in the 2 weeks post procedure, please call and report this information to [place].    If any biopsies were taken you will be contacted by phone or by letter within the next 1-3 weeks.  Please call us at [phone] if you have not heard about the biopsies in 3 weeks.    SIGNATURES/CONFIDENTIALITY: You and/or your care partner have signed paperwork which will be entered into your electronic medical record.  These signatures attest to the fact that that the information above on your After Visit Summary has been reviewed and is understood.  Full responsibility of the confidentiality of this discharge information lies with you and/or your care-partner.

## 2022-07-15 ENCOUNTER — Telehealth: Payer: Self-pay

## 2022-07-15 NOTE — Telephone Encounter (Signed)
  Follow up Call-     07/14/2022   10:19 AM  Call back number  Permission to leave phone message Yes     Patient questions:  Do you have a fever, pain , or abdominal swelling? No. Pain Score  0 *  Have you tolerated food without any problems? Yes.    Have you been able to return to your normal activities? Yes.    Do you have any questions about your discharge instructions: Diet   No. Medications  No. Follow up visit  No.  Do you have questions or concerns about your Care? No.  Actions: * If pain score is 4 or above: No action needed, pain <4.

## 2022-07-18 ENCOUNTER — Telehealth: Payer: Self-pay | Admitting: Genetic Counselor

## 2022-07-18 NOTE — Telephone Encounter (Signed)
Scheduled appt per 7/27 referral. Pt is aware of appt date and time. Pt is aware to arrive 15 mins prior to appt time and to bring and updated insurance card. Pt is aware of appt location.   

## 2022-07-31 ENCOUNTER — Encounter: Payer: Self-pay | Admitting: Gastroenterology

## 2022-08-09 DIAGNOSIS — R531 Weakness: Secondary | ICD-10-CM | POA: Diagnosis not present

## 2022-08-09 DIAGNOSIS — M25552 Pain in left hip: Secondary | ICD-10-CM | POA: Diagnosis not present

## 2022-08-09 DIAGNOSIS — Z96642 Presence of left artificial hip joint: Secondary | ICD-10-CM | POA: Diagnosis not present

## 2022-08-09 DIAGNOSIS — R262 Difficulty in walking, not elsewhere classified: Secondary | ICD-10-CM | POA: Diagnosis not present

## 2022-09-06 ENCOUNTER — Other Ambulatory Visit: Payer: Self-pay | Admitting: Genetic Counselor

## 2022-09-06 DIAGNOSIS — Z86010 Personal history of colonic polyps: Secondary | ICD-10-CM

## 2022-09-07 ENCOUNTER — Inpatient Hospital Stay: Payer: Medicare Other | Attending: Genetic Counselor | Admitting: Genetic Counselor

## 2022-09-07 ENCOUNTER — Inpatient Hospital Stay: Payer: Medicare Other

## 2022-09-07 ENCOUNTER — Encounter: Payer: Self-pay | Admitting: Genetic Counselor

## 2022-09-07 ENCOUNTER — Other Ambulatory Visit: Payer: Self-pay

## 2022-09-07 DIAGNOSIS — K635 Polyp of colon: Secondary | ICD-10-CM

## 2022-09-07 DIAGNOSIS — Z800 Family history of malignant neoplasm of digestive organs: Secondary | ICD-10-CM

## 2022-09-07 DIAGNOSIS — Z86010 Personal history of colonic polyps: Secondary | ICD-10-CM

## 2022-09-07 DIAGNOSIS — C73 Malignant neoplasm of thyroid gland: Secondary | ICD-10-CM

## 2022-09-07 DIAGNOSIS — Z8041 Family history of malignant neoplasm of ovary: Secondary | ICD-10-CM | POA: Diagnosis not present

## 2022-09-07 LAB — GENETIC SCREENING ORDER

## 2022-09-07 NOTE — Progress Notes (Signed)
REFERRING PROVIDER: Tressia Danas, MD [address] [place],  [place] [address]  PRIMARY PROVIDER:  Adrian Prince, MD  PRIMARY REASON FOR VISIT:  1. Family history of ovarian cancer   2. Family history of pancreatic cancer   3. THYROID CANCER   4. Polyposis of colon      HISTORY OF PRESENT ILLNESS:   April Floyd, a 71 y.o. female, was seen for a [facility] cancer genetics consultation at the request of Dr. Orvan Falconer due to a personal and family history of cancer and personal history of polyposis.  April Floyd presents to clinic today to discuss the possibility of a hereditary predisposition to cancer, genetic testing, and to further clarify her future cancer risks, as well as potential cancer risks for family members.   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 The treatment plan thyroidectomy.  April Floyd has also had over 10 colon polyps - tubular adenomas - on colonoscopies.    CANCER HISTORY:  Oncology History   No history exists.     RISK FACTORS:  Menarche was at age 52.  First live birth at age 86.  OCP use for approximately  5-10  years.  Ovaries intact: unsure.  Hysterectomy: yes.  Menopausal status: postmenopausal.  HRT use: 0 years. Colonoscopy: yes; abnormal. Mammogram within the last year: yes. Number of breast biopsies: 0. Up to date with pelvic exams: yes. Any excessive radiation exposure in the past: no  Past Medical History:  Diagnosis Date   Colonic polyp    COVID-19    DVT (deep venous thrombosis) (HCC) 2003   after thyroid surgery   Family history of ovarian cancer    Family history of pancreatic cancer    Fibroid    Hamartoma (HCC)    right breast   Hypertension    with pregnancy   Insomnia    Thyroid cancer (HCC) 2003    Past Surgical History:  Procedure Laterality Date   ABDOMINAL HYSTERECTOMY  2009   cervical polyp resection     CESAREAN SECTION  1985 and 1987   COLONOSCOPY  11/30/2012    POLYPECTOMY     THYROIDECTOMY  2003   TOTAL HIP ARTHROPLASTY Left 2022    Social History   Socioeconomic History   Marital status: Married    Spouse name: Not on file   Number of children: Not on file   Years of education: Not on file   Highest education level: Not on file  Occupational History   Not on file  Tobacco Use   Smoking status: Never   Smokeless tobacco: Never  Vaping Use   Vaping Use: Never used  Substance and Sexual Activity   Alcohol use: No   Drug use: No   Sexual activity: Yes    Partners: Male    Birth control/protection: Surgical    Comment: TAH  Other Topics Concern   Not on file  Social History Narrative   ** Merged History Encounter **       Social Determinants of Health   Financial Resource Strain: Not on file  Food Insecurity: Not on file  Transportation Needs: Not on file  Physical Activity: Not on file  Stress: Not on file  Social Connections: Not on file     FAMILY HISTORY:  We obtained a detailed, 4-generation family history.  Significant diagnoses are listed below: Family History  Problem Relation Age of Onset   Diabetes Mother  Ovarian cancer Sister 35   Lung cancer Maternal Aunt 50       non-smoker   Pancreatic cancer Paternal Aunt    Lung cancer Paternal Uncle    Heart attack Paternal Uncle        d. in his 58s   Brain cancer Maternal Grandfather    Asthma Son    High Cholesterol Son        dx at 33   Colon cancer Neg Hx    Stomach cancer Neg Hx    Colon polyps Neg Hx    Rectal cancer Neg Hx    Esophageal cancer Neg Hx      The patient has a son and daughter who are cancer free.  She has a brother and sister.  Her sister had ovarian cancer at 53.  Her parents are both deceased.    The patient's mother died of leukemia.  She had two sisters, one who died at age 5 and the other who was a non-smoker and had lung cancer at 36.  Both grandparents are deceased.  The grandmother died of old age and the grandfather from  brain cancer.  The patient's father died at 32.  He had a full brother and a maternal half brother and sister.  His sister died of pancreatic cancer, and his half brother died early of a heart attack.  His full brother died of lung cancer and was a non-smoker.  April Floyd is unaware of previous family history of genetic testing for hereditary cancer risks. Patient's maternal ancestors are of English/Scottish descent, and paternal ancestors are of English/Scottish descent. There is no reported Ashkenazi Jewish ancestry. There is no known consanguinity.  GENETIC COUNSELING ASSESSMENT: April Floyd is a 71 y.o. female with a personal and family history of cancer and a personal history of polyposis which is somewhat suggestive of a hereditary cancer syndrome and predisposition to cancer given the polyposis and thyroid cancer combination and other cancers in the family. We, therefore, discussed and recommended the following at today's visit.   DISCUSSION: We discussed that, in general, most cancer is not inherited in families, but instead is sporadic or familial. Sporadic cancers occur by chance and typically happen at older ages (>50 years) as this type of cancer is caused by genetic changes acquired during an individual's lifetime. Some families have more cancers than would be expected by chance; however, the ages or types of cancer are not consistent with a known genetic mutation or known genetic mutations have been ruled out. This type of familial cancer is thought to be due to a combination of multiple genetic, environmental, hormonal, and lifestyle factors. While this combination of factors likely increases the risk of cancer, the exact source of this risk is not currently identifiable or testable.  We discussed that many people develop colon polyps, but most will have fewer than 5.  Once an individual has a total of 10 or more tubular adenomas, we consider that polyposis and an increased risk for colon  cancer.  About 5 - 7% of colon cancer is hereditary, with most cases associated with Lynch syndrome.  There are other genes that can be associated with hereditary colon cancer syndromes.  These include APC and MUTYH, especially when we think of polyposis syndromes.  We discussed that testing is beneficial for several reasons including knowing how to follow individuals after completing their treatment, identifying whether potential treatment options such as PARP inhibitors would be beneficial, and understand if other family  members could be at risk for cancer and allow them to undergo genetic testing.   We reviewed the characteristics, features and inheritance patterns of hereditary cancer syndromes. We also discussed genetic testing, including the appropriate family members to test, the process of testing, insurance coverage and turn-around-time for results. We discussed the implications of a negative, positive, carrier and/or variant of uncertain significant result. April Floyd  was offered a common hereditary cancer panel (47 genes) and an expanded pan-cancer panel (77 genes). April Floyd was informed of the benefits and limitations of each panel, including that expanded pan-cancer panels contain genes that do not have clear management guidelines at this point in time.  We also discussed that as the number of genes included on a panel increases, the chances of variants of uncertain significance increases. April Floyd decided to pursue genetic testing for the CancerNext-Expanded+RNAinsight gene panel.   The CancerNext-Expanded gene panel offered by [facility] and includes sequencing and rearrangement analysis for the following 77 genes: AIP, ALK, APC*, ATM*, AXIN2, BAP1, BARD1, BLM, BMPR1A, BRCA1*, BRCA2*, BRIP1*, CDC73, CDH1*, CDK4, CDKN1B, CDKN2A, CHEK2*, CTNNA1, DICER1, FANCC, FH, FLCN, GALNT12, KIF1B, LZTR1, MAX, MEN1, MET, MLH1*, MSH2*, MSH3, MSH6*, MUTYH*, NBN, NF1*, NF2, NTHL1, PALB2*, PHOX2B, PMS2*, POT1,  PRKAR1A, PTCH1, PTEN*, RAD51C*, RAD51D*, RB1, RECQL, RET, SDHA, SDHAF2, SDHB, SDHC, SDHD, SMAD4, SMARCA4, SMARCB1, SMARCE1, STK11, SUFU, TMEM127, TP53*, TSC1, TSC2, VHL and XRCC2 (sequencing and deletion/duplication); EGFR, EGLN1, HOXB13, KIT, MITF, PDGFRA, POLD1, and POLE (sequencing only); EPCAM and GREM1 (deletion/duplication only). DNA and RNA analyses performed for * genes.   Based on April Floyd's personal and family history of cancer, she meets medical criteria for genetic testing. Despite that she meets criteria, she may still have an out of pocket cost. We discussed that if her out of pocket cost for testing is over $100, the laboratory will call and confirm whether she wants to proceed with testing.  If the out of pocket cost of testing is less than $100 she will be billed by the genetic testing laboratory.   We discussed that some people do not want to undergo genetic testing due to fear of genetic discrimination.  A federal law called the Genetic Information Non-Discrimination Act (GINA) of 2008 helps protect individuals against genetic discrimination based on their genetic test results.  It impacts both health insurance and employment.  With health insurance, it protects against increased premiums, being kicked off insurance or being forced to take a test in order to be insured.  For employment it protects against hiring, firing and promoting decisions based on genetic test results.  GINA does not apply to those in the [employer], those who work for companies with less than 15 employees, and new life insurance or long-term disability insurance policies.  Health status due to a cancer diagnosis is not protected under GINA.   April Floyd reported that her husband had a heart attack at 61 and had elevated cholesterol levels. His father died at 51 from a heart attack and her son was found to have elevated cholesterol levels at age 38 and has been on medication since that time.  We discussed that this is  suggestive of Familial Hypercholesterolemia (FH).  She will discuss this with her husband, and we recommended that they consider genetic testing through the Cardiology genetics clinic.  PLAN: After considering the risks, benefits, and limitations, April Floyd provided informed consent to pursue genetic testing and the blood sample was sent to [facility] for analysis of the CancerNext-Expanded+RNAinsight  panel. Results should be available within approximately 2-3 weeks' time, at which point they will be disclosed by telephone to April Floyd, as will any additional recommendations warranted by these results. April Floyd will receive a summary of her genetic counseling visit and a copy of her results once available. This information will also be available in Epic.   Lastly, we encouraged April Floyd to remain in contact with cancer genetics annually so that we can continuously update the family history and inform her of any changes in cancer genetics and testing that may be of benefit for this family.   April Floyd questions were answered to her satisfaction today. Our contact information was provided should additional questions or concerns arise. Thank you for the referral and allowing [place] to share in the care of your patient.   Junah Yam P. Florene Glen, [place], [facility] Licensed, Insurance risk surveyor [email or web address] phone: [phone]  The patient was seen for a total of 45 minutes in face-to-face genetic counseling.  The patient was seen alone.  Drs. Michell Heinrich, and/or Spooner were available for questions, if needed..    _______________________________________________________________________ For Office Staff:  Number of people involved in session: 1 Was an Intern/ student involved with case: no

## 2022-09-19 ENCOUNTER — Telehealth: Payer: Self-pay | Admitting: Genetic Counselor

## 2022-09-19 ENCOUNTER — Encounter: Payer: Self-pay | Admitting: Genetic Counselor

## 2022-09-19 DIAGNOSIS — Z1379 Encounter for other screening for genetic and chromosomal anomalies: Secondary | ICD-10-CM | POA: Insufficient documentation

## 2022-09-19 NOTE — Telephone Encounter (Signed)
LM on VM that results are back and to please call.  Left CB instructions. 

## 2022-09-19 NOTE — Telephone Encounter (Signed)
Revealed negative genetic testing.  Discussed that we do not know why she has polyposis or had thyroid cancer or why there is cancer in the family. It could be due to a different gene that we are not testing, or maybe our current technology may not be able to pick something up.  It will be important for her to keep in contact with genetics to keep up with whether additional testing may be needed.

## 2022-09-20 ENCOUNTER — Ambulatory Visit: Payer: Self-pay | Admitting: Genetic Counselor

## 2022-09-20 DIAGNOSIS — L814 Other melanin hyperpigmentation: Secondary | ICD-10-CM | POA: Diagnosis not present

## 2022-09-20 DIAGNOSIS — Z1379 Encounter for other screening for genetic and chromosomal anomalies: Secondary | ICD-10-CM

## 2022-09-20 DIAGNOSIS — L821 Other seborrheic keratosis: Secondary | ICD-10-CM | POA: Diagnosis not present

## 2022-09-20 DIAGNOSIS — D2339 Other benign neoplasm of skin of other parts of face: Secondary | ICD-10-CM | POA: Diagnosis not present

## 2022-09-20 DIAGNOSIS — L578 Other skin changes due to chronic exposure to nonionizing radiation: Secondary | ICD-10-CM | POA: Diagnosis not present

## 2022-09-20 NOTE — Progress Notes (Signed)
HPI:  Ms. Godette was previously seen in the [facility] clinic due to a personal and family history of cancer and personal history of polyposis and concerns regarding a hereditary predisposition to cancer. Please refer to our prior cancer genetics clinic note for more information regarding our discussion, assessment and recommendations, at the time. Ms. Pennino recent genetic test results were disclosed to her, as were recommendations warranted by these results. These results and recommendations are discussed in more detail below.  CANCER HISTORY:  Oncology History  THYROID CANCER  01/09/2008 Initial Diagnosis   THYROID CANCER   09/17/2022 Genetic Testing   Negative genetic testing on the CancerNext-Expanded+RNAinsight panel.  The report date is 09/17/2022.  The CancerNext-Expanded gene panel offered by [facility] and includes sequencing and rearrangement analysis for the following 77 genes: AIP, ALK, APC*, ATM*, AXIN2, BAP1, BARD1, BLM, BMPR1A, BRCA1*, BRCA2*, BRIP1*, CDC73, CDH1*, CDK4, CDKN1B, CDKN2A, CHEK2*, CTNNA1, DICER1, FANCC, FH, FLCN, GALNT12, KIF1B, LZTR1, MAX, MEN1, MET, MLH1*, MSH2*, MSH3, MSH6*, MUTYH*, NBN, NF1*, NF2, NTHL1, PALB2*, PHOX2B, PMS2*, POT1, PRKAR1A, PTCH1, PTEN*, RAD51C*, RAD51D*, RB1, RECQL, RET, SDHA, SDHAF2, SDHB, SDHC, SDHD, SMAD4, SMARCA4, SMARCB1, SMARCE1, STK11, SUFU, TMEM127, TP53*, TSC1, TSC2, VHL and XRCC2 (sequencing and deletion/duplication); EGFR, EGLN1, HOXB13, KIT, MITF, PDGFRA, POLD1, and POLE (sequencing only); EPCAM and GREM1 (deletion/duplication only). DNA and RNA analyses performed for * genes.      FAMILY HISTORY:  We obtained a detailed, 4-generation family history.  Significant diagnoses are listed below: Family History  Problem Relation Age of Onset   Diabetes Mother    Ovarian cancer Sister 71   Lung cancer Maternal Aunt 88       non-smoker   Pancreatic cancer Paternal Aunt    Lung cancer Paternal Uncle    Heart attack  Paternal Uncle        d. in his 14s   Brain cancer Maternal Grandfather    Asthma Son    High Cholesterol Son        dx at 70   Colon cancer Neg Hx    Stomach cancer Neg Hx    Colon polyps Neg Hx    Rectal cancer Neg Hx    Esophageal cancer Neg Hx        The patient has a son and daughter who are cancer free.  She has a brother and sister.  Her sister had ovarian cancer at 60.  Her parents are both deceased.     The patient's mother died of leukemia.  She had two sisters, one who died at age 9 and the other who was a non-smoker and had lung cancer at 74.  Both grandparents are deceased.  The grandmother died of old age and the grandfather from brain cancer.   The patient's father died at 17.  He had a full brother and a maternal half brother and sister.  His sister died of pancreatic cancer, and his half brother died early of a heart attack.  His full brother died of lung cancer and was a non-smoker.   Ms. Bonsall is unaware of previous family history of genetic testing for hereditary cancer risks. Patient's maternal ancestors are of English/Scottish descent, and paternal ancestors are of English/Scottish descent. There is no reported Ashkenazi Jewish ancestry. There is no known consanguinity.  GENETIC TEST RESULTS: Genetic testing reported out on September 17, 2022 through the CancerNext-Expanded+RNAinsight cancer panel found no pathogenic mutations. The CancerNext-Expanded gene panel offered by [facility] and includes sequencing and  rearrangement analysis for the following 77 genes: AIP, ALK, APC*, ATM*, AXIN2, BAP1, BARD1, BLM, BMPR1A, BRCA1*, BRCA2*, BRIP1*, CDC73, CDH1*, CDK4, CDKN1B, CDKN2A, CHEK2*, CTNNA1, DICER1, FANCC, FH, FLCN, GALNT12, KIF1B, LZTR1, MAX, MEN1, MET, MLH1*, MSH2*, MSH3, MSH6*, MUTYH*, NBN, NF1*, NF2, NTHL1, PALB2*, PHOX2B, PMS2*, POT1, PRKAR1A, PTCH1, PTEN*, RAD51C*, RAD51D*, RB1, RECQL, RET, SDHA, SDHAF2, SDHB, SDHC, SDHD, SMAD4, SMARCA4, SMARCB1, SMARCE1, STK11,  SUFU, TMEM127, TP53*, TSC1, TSC2, VHL and XRCC2 (sequencing and deletion/duplication); EGFR, EGLN1, HOXB13, KIT, MITF, PDGFRA, POLD1, and POLE (sequencing only); EPCAM and GREM1 (deletion/duplication only). DNA and RNA analyses performed for * genes. The test report has been scanned into EPIC and is located under the Molecular Pathology section of the Results Review tab.  A portion of the result report is included below for reference.     We discussed with Ms. Marconi that because current genetic testing is not perfect, it is possible there may be a gene mutation in one of these genes that current testing cannot detect, but that chance is small.  We also discussed, that there could be another gene that has not yet been discovered, or that we have not yet tested, that is responsible for the cancer diagnoses in the family. It is also possible there is a hereditary cause for the cancer in the family that Ms. Bishara did not inherit and therefore was not identified in her testing.  Therefore, it is important to remain in touch with cancer genetics in the future so that we can continue to offer Ms. Pontarelli the most up to date genetic testing.   ADDITIONAL GENETIC TESTING: We discussed with Ms. Aveni that her genetic testing was fairly extensive.  If there are genes identified to increase cancer risk that can be analyzed in the future, we would be happy to discuss and coordinate this testing at that time.    CANCER SCREENING RECOMMENDATIONS: Ms. Mikel test result is considered negative (normal).  This means that we have not identified a hereditary cause for her personal and family history of cancer at this time. Most cancers happen by chance and this negative test suggests that her cancer may fall into this category.    While reassuring, this does not definitively rule out a hereditary predisposition to cancer. It is still possible that there could be genetic mutations that are undetectable by current technology.  There could be genetic mutations in genes that have not been tested or identified to increase cancer risk.  Therefore, it is recommended she continue to follow the cancer management and screening guidelines provided by her oncology and primary healthcare provider.   An individual's cancer risk and medical management are not determined by genetic test results alone. Overall cancer risk assessment incorporates additional factors, including personal medical history, family history, and any available genetic information that may result in a personalized plan for cancer prevention and surveillance  RECOMMENDATIONS FOR FAMILY MEMBERS:  Individuals in this family might be at some increased risk of developing cancer, over the general population risk, simply due to the family history of cancer.  We recommended women in this family have a yearly mammogram beginning at age 46, or 67 years younger than the earliest onset of cancer, an annual clinical breast exam, and perform monthly breast self-exams. Women in this family should also have a gynecological exam as recommended by their primary provider. All family members should be referred for colonoscopy starting at age 77.  FOLLOW-UP: Lastly, we discussed with Ms. Belmonte that cancer genetics is a  rapidly advancing field and it is possible that new genetic tests will be appropriate for her and/or her family members in the future. We encouraged her to remain in contact with cancer genetics on an annual basis so we can update her personal and family histories and let her know of advances in cancer genetics that may benefit this family.   Our contact number was provided. Ms. Reuter questions were answered to her satisfaction, and she knows she is welcome to call us at anytime with additional questions or concerns.   Roma Kayser, [place], [facility] Licensed, Certified Genetic Counselor [email or web address]

## 2022-10-20 DIAGNOSIS — Z1231 Encounter for screening mammogram for malignant neoplasm of breast: Secondary | ICD-10-CM | POA: Diagnosis not present

## 2022-10-24 ENCOUNTER — Encounter (HOSPITAL_BASED_OUTPATIENT_CLINIC_OR_DEPARTMENT_OTHER): Payer: Self-pay | Admitting: *Deleted

## 2022-11-03 DIAGNOSIS — M8580 Other specified disorders of bone density and structure, unspecified site: Secondary | ICD-10-CM | POA: Diagnosis not present

## 2022-11-03 DIAGNOSIS — D126 Benign neoplasm of colon, unspecified: Secondary | ICD-10-CM | POA: Diagnosis not present

## 2022-11-03 DIAGNOSIS — E890 Postprocedural hypothyroidism: Secondary | ICD-10-CM | POA: Diagnosis not present

## 2022-11-03 DIAGNOSIS — C73 Malignant neoplasm of thyroid gland: Secondary | ICD-10-CM | POA: Diagnosis not present

## 2022-11-29 DIAGNOSIS — M79671 Pain in right foot: Secondary | ICD-10-CM | POA: Diagnosis not present

## 2022-12-22 DIAGNOSIS — M79671 Pain in right foot: Secondary | ICD-10-CM | POA: Diagnosis not present

## 2023-01-10 DIAGNOSIS — M79671 Pain in right foot: Secondary | ICD-10-CM | POA: Diagnosis not present

## 2023-01-20 DIAGNOSIS — R1110 Vomiting, unspecified: Secondary | ICD-10-CM | POA: Diagnosis not present

## 2023-01-20 DIAGNOSIS — R630 Anorexia: Secondary | ICD-10-CM | POA: Diagnosis not present

## 2023-01-31 DIAGNOSIS — M79671 Pain in right foot: Secondary | ICD-10-CM | POA: Diagnosis not present

## 2023-02-02 DIAGNOSIS — H524 Presbyopia: Secondary | ICD-10-CM | POA: Diagnosis not present

## 2023-02-16 DIAGNOSIS — K080 Exfoliation of teeth due to systemic causes: Secondary | ICD-10-CM | POA: Diagnosis not present

## 2023-06-16 ENCOUNTER — Encounter (HOSPITAL_BASED_OUTPATIENT_CLINIC_OR_DEPARTMENT_OTHER): Payer: Self-pay | Admitting: Obstetrics & Gynecology

## 2023-06-16 ENCOUNTER — Ambulatory Visit (INDEPENDENT_AMBULATORY_CARE_PROVIDER_SITE_OTHER): Payer: Medicare Other | Admitting: Obstetrics & Gynecology

## 2023-06-16 VITALS — BP 124/77 | HR 77 | Ht 66.0 in | Wt 133.8 lb

## 2023-06-16 DIAGNOSIS — Z01419 Encounter for gynecological examination (general) (routine) without abnormal findings: Secondary | ICD-10-CM | POA: Diagnosis not present

## 2023-06-16 DIAGNOSIS — Z86718 Personal history of other venous thrombosis and embolism: Secondary | ICD-10-CM

## 2023-06-16 DIAGNOSIS — Z13820 Encounter for screening for osteoporosis: Secondary | ICD-10-CM

## 2023-06-16 DIAGNOSIS — E2839 Other primary ovarian failure: Secondary | ICD-10-CM | POA: Diagnosis not present

## 2023-06-16 DIAGNOSIS — G4700 Insomnia, unspecified: Secondary | ICD-10-CM

## 2023-06-16 MED ORDER — TEMAZEPAM 15 MG PO CAPS: ORAL_CAPSULE | ORAL | 0 refills | Status: DC

## 2023-06-16 NOTE — Progress Notes (Signed)
72 y.o. G2P0000 Married White or Caucasian female here for breast and pelvic exam.  I am also following her for postmenopausal status and h/o vaginal atrophic changes.  Denies vaginal bleeding.  Also has some insomnia issues and uses temazepam rarely but sometimes with travel.  Needs RF.  Family is good.  Patient's last menstrual period was 07/20/2007.          Sexually active: Yes.    H/O STD:  no  Health Maintenance: PCP:  Dr. Evlyn Kanner.  Is seen once yearly. Vaccines are up to date:  she is pretty sure she's had pneumonia vaccination and tdap.  Has not done shingrix. Colonoscopy:  07/14/2022, follow up 3 years MMG:  10/20/2022 Negative BMD:  02/19/2014 H/o abnormal pap smear:  no    reports that she has never smoked. She has never used smokeless tobacco. She reports that she does not drink alcohol and does not use drugs.  Past Medical History:  Diagnosis Date   Colonic polyp    COVID-19    DVT (deep venous thrombosis) (HCC) 2003   after thyroid surgery   Family history of ovarian cancer    Family history of pancreatic cancer    Fibroid    Hamartoma (HCC)    right breast   Hypertension    with pregnancy   Insomnia    Thyroid cancer (HCC) 2003    Past Surgical History:  Procedure Laterality Date   ABDOMINAL HYSTERECTOMY  2009   cervical polyp resection     CESAREAN SECTION  1985 and 1987   COLONOSCOPY  11/30/2012   POLYPECTOMY     THYROIDECTOMY  2003   TOTAL HIP ARTHROPLASTY Left 2022    Current Outpatient Medications  Medication Sig Dispense Refill   Multiple Vitamin (MULTIVITAMIN) tablet Take 1 tablet by mouth daily.     SYNTHROID 137 MCG tablet Take 137 mcg by mouth daily.      temazepam (RESTORIL) 15 MG capsule TAKE 1 CAPSULE AT BEDTIME AS NEEDED FOR SLEEP. 30 capsule 0   No current facility-administered medications for this visit.    Family History  Problem Relation Age of Onset   Diabetes Mother    Ovarian cancer Sister 35   Lung cancer Maternal Aunt  70       non-smoker   Pancreatic cancer Paternal Aunt    Lung cancer Paternal Uncle    Heart attack Paternal Uncle        d. in his 10s   Brain cancer Maternal Grandfather    Asthma Son    High Cholesterol Son        dx at 7   Colon cancer Neg Hx    Stomach cancer Neg Hx    Colon polyps Neg Hx    Rectal cancer Neg Hx    Esophageal cancer Neg Hx     Review of Systems  Constitutional: Negative.   Genitourinary: Negative.     Exam:   BP 124/77 (BP Location: Right Arm, Patient Position: Sitting, Cuff Size: Large)   Pulse 77   Ht 5\' 6"  (1.676 m) Comment: Reported  Wt 133 lb 12.8 oz (60.7 kg)   LMP 07/20/2007   BMI 21.60 kg/m   Height: 5\' 6"  (167.6 cm) (Reported)  General appearance: alert, cooperative and appears stated age Breasts: normal appearance, no masses or tenderness Abdomen: soft, non-tender; bowel sounds normal; no masses,  no organomegaly Lymph nodes: Cervical, supraclavicular, and axillary nodes normal.  No abnormal inguinal nodes palpated Neurologic: Grossly  normal  Pelvic: External genitalia:  no lesions              Urethra:  normal appearing urethra with no masses, tenderness or lesions              Bartholins and Skenes: normal                 Vagina: normal appearing vagina with atrophic changes and no discharge, no lesions              Cervix: absent              Pap taken: No. Bimanual Exam:  Uterus:  uterus absent              Adnexa: no mass, fullness, tenderness               Rectovaginal: Confirms               Anus:  normal sphincter tone, no lesions  Chaperone, Ina Homes, CMA, was present for exam.  Assessment/Plan: 1. Encntr for gyn exam (general) (routine) w/o abn findings - Pap smear not indicated - Mammogram 10/20/2022 - Colonoscopy 07/14/2022, follow up 3 years - Bone mineral density 02/19/2014.  Will update this year. - lab work done with PCP, Dr. Evlyn Kanner - vaccines reviewed/updated  2. Hypoestrogenism - DG BONE DENSITY (DXA);  Future  3. Insomnia, unspecified type - temazepam (RESTORIL) 15 MG capsule; TAKE 1 CAPSULE AT BEDTIME AS NEEDED FOR SLEEP.  Dispense: 30 capsule; Refill: 0  4. History of DVT (deep vein thrombosis)

## 2023-06-20 DIAGNOSIS — F3342 Major depressive disorder, recurrent, in full remission: Secondary | ICD-10-CM | POA: Diagnosis not present

## 2023-07-14 DIAGNOSIS — D649 Anemia, unspecified: Secondary | ICD-10-CM | POA: Diagnosis not present

## 2023-07-14 DIAGNOSIS — C73 Malignant neoplasm of thyroid gland: Secondary | ICD-10-CM | POA: Diagnosis not present

## 2023-07-14 DIAGNOSIS — E890 Postprocedural hypothyroidism: Secondary | ICD-10-CM | POA: Diagnosis not present

## 2023-07-14 DIAGNOSIS — M8580 Other specified disorders of bone density and structure, unspecified site: Secondary | ICD-10-CM | POA: Diagnosis not present

## 2023-08-23 DIAGNOSIS — K080 Exfoliation of teeth due to systemic causes: Secondary | ICD-10-CM | POA: Diagnosis not present

## 2023-10-30 DIAGNOSIS — Z1231 Encounter for screening mammogram for malignant neoplasm of breast: Secondary | ICD-10-CM | POA: Diagnosis not present

## 2023-11-02 ENCOUNTER — Other Ambulatory Visit (HOSPITAL_COMMUNITY)
Admission: RE | Admit: 2023-11-02 | Discharge: 2023-11-02 | Disposition: A | Discharge status: Home / Self Care | Payer: Medicare Other | Source: Ambulatory Visit | Attending: Obstetrics & Gynecology | Admitting: Obstetrics & Gynecology

## 2023-11-02 ENCOUNTER — Encounter (HOSPITAL_BASED_OUTPATIENT_CLINIC_OR_DEPARTMENT_OTHER): Payer: Self-pay | Admitting: *Deleted

## 2023-11-02 ENCOUNTER — Ambulatory Visit (HOSPITAL_BASED_OUTPATIENT_CLINIC_OR_DEPARTMENT_OTHER): Payer: Medicare Other | Admitting: *Deleted

## 2023-11-02 VITALS — BP 130/84 | HR 89 | Ht 66.0 in | Wt 141.6 lb

## 2023-11-02 DIAGNOSIS — R300 Dysuria: Secondary | ICD-10-CM | POA: Diagnosis not present

## 2023-11-02 DIAGNOSIS — N898 Other specified noninflammatory disorders of vagina: Secondary | ICD-10-CM | POA: Diagnosis not present

## 2023-11-02 LAB — POCT URINALYSIS DIPSTICK
Appearance: NORMAL
Bilirubin, UA: NEGATIVE
Glucose, UA: NEGATIVE
Ketones, UA: NEGATIVE
Nitrite, UA: NEGATIVE
Protein, UA: NEGATIVE
Spec Grav, UA: 1.02 (ref 1.010–1.025)
Urobilinogen, UA: 0.2 U/dL
pH, UA: 7 (ref 5.0–8.0)

## 2023-11-02 MED ORDER — NITROFURANTOIN MONOHYD MACRO 100 MG PO CAPS: 100.0000 mg | ORAL_CAPSULE | Freq: Two times a day (BID) | ORAL | 0 refills | Status: AC

## 2023-11-02 NOTE — Progress Notes (Signed)
Pt presents to office with complaints of urinary urgency and burning with urination. Pt also reports some vaginal itching/irritation. Pt instructed on and performed self swab as well as clean catch urine. Rx sent to pharmacy for treatment of urinary symptoms. Advised pt that we would notify her of vaginitis results and recommendations once they have been received.

## 2023-11-03 LAB — CERVICOVAGINAL ANCILLARY ONLY
Bacterial Vaginitis (gardnerella): POSITIVE — AB
Candida Glabrata: NEGATIVE
Candida Vaginitis: NEGATIVE
Comment: NEGATIVE
Comment: NEGATIVE
Comment: NEGATIVE

## 2023-11-04 MED ORDER — METRONIDAZOLE 500 MG PO TABS: 500.0000 mg | ORAL_TABLET | Freq: Two times a day (BID) | ORAL | 0 refills | Status: AC

## 2023-11-04 NOTE — Addendum Note (Signed)
Addended by: Jerene Bears on: 11/04/2023 08:33 AM   Modules accepted: Orders

## 2023-11-06 LAB — URINE CULTURE

## 2023-11-07 ENCOUNTER — Encounter (HOSPITAL_BASED_OUTPATIENT_CLINIC_OR_DEPARTMENT_OTHER): Payer: Self-pay | Admitting: Obstetrics & Gynecology

## 2023-12-06 DIAGNOSIS — D485 Neoplasm of uncertain behavior of skin: Secondary | ICD-10-CM | POA: Diagnosis not present

## 2023-12-06 DIAGNOSIS — L578 Other skin changes due to chronic exposure to nonionizing radiation: Secondary | ICD-10-CM | POA: Diagnosis not present

## 2023-12-06 DIAGNOSIS — D225 Melanocytic nevi of trunk: Secondary | ICD-10-CM | POA: Diagnosis not present

## 2023-12-06 DIAGNOSIS — D2339 Other benign neoplasm of skin of other parts of face: Secondary | ICD-10-CM | POA: Diagnosis not present

## 2023-12-06 DIAGNOSIS — L821 Other seborrheic keratosis: Secondary | ICD-10-CM | POA: Diagnosis not present

## 2023-12-06 DIAGNOSIS — L570 Actinic keratosis: Secondary | ICD-10-CM | POA: Diagnosis not present

## 2023-12-06 DIAGNOSIS — B079 Viral wart, unspecified: Secondary | ICD-10-CM | POA: Diagnosis not present

## 2024-02-27 DIAGNOSIS — D1801 Hemangioma of skin and subcutaneous tissue: Secondary | ICD-10-CM | POA: Diagnosis not present

## 2024-03-06 DIAGNOSIS — K080 Exfoliation of teeth due to systemic causes: Secondary | ICD-10-CM | POA: Diagnosis not present

## 2024-04-08 DIAGNOSIS — K080 Exfoliation of teeth due to systemic causes: Secondary | ICD-10-CM | POA: Diagnosis not present

## 2024-04-11 DIAGNOSIS — H5203 Hypermetropia, bilateral: Secondary | ICD-10-CM | POA: Diagnosis not present

## 2024-05-28 DIAGNOSIS — D1801 Hemangioma of skin and subcutaneous tissue: Secondary | ICD-10-CM | POA: Diagnosis not present

## 2024-05-28 DIAGNOSIS — L820 Inflamed seborrheic keratosis: Secondary | ICD-10-CM | POA: Diagnosis not present

## 2024-05-30 DIAGNOSIS — M25551 Pain in right hip: Secondary | ICD-10-CM | POA: Diagnosis not present

## 2024-06-13 DIAGNOSIS — M25551 Pain in right hip: Secondary | ICD-10-CM | POA: Diagnosis not present

## 2024-06-20 DIAGNOSIS — M160 Bilateral primary osteoarthritis of hip: Secondary | ICD-10-CM | POA: Diagnosis not present

## 2024-06-20 DIAGNOSIS — M7061 Trochanteric bursitis, right hip: Secondary | ICD-10-CM | POA: Diagnosis not present

## 2024-06-25 DIAGNOSIS — M25551 Pain in right hip: Secondary | ICD-10-CM | POA: Diagnosis not present

## 2024-06-26 ENCOUNTER — Other Ambulatory Visit (HOSPITAL_BASED_OUTPATIENT_CLINIC_OR_DEPARTMENT_OTHER): Payer: Self-pay | Admitting: Obstetrics & Gynecology

## 2024-06-26 DIAGNOSIS — G4700 Insomnia, unspecified: Secondary | ICD-10-CM

## 2024-06-26 NOTE — Telephone Encounter (Signed)
 LMOM at 3:12 for patient to call office about medication. Need to know if patient requested medication. Set up appointment. Has not been seen in over a year. tbw

## 2024-07-16 DIAGNOSIS — Z1339 Encounter for screening examination for other mental health and behavioral disorders: Secondary | ICD-10-CM | POA: Diagnosis not present

## 2024-07-16 DIAGNOSIS — M8580 Other specified disorders of bone density and structure, unspecified site: Secondary | ICD-10-CM | POA: Diagnosis not present

## 2024-07-16 DIAGNOSIS — E890 Postprocedural hypothyroidism: Secondary | ICD-10-CM | POA: Diagnosis not present

## 2024-07-16 DIAGNOSIS — R82998 Other abnormal findings in urine: Secondary | ICD-10-CM | POA: Diagnosis not present

## 2024-07-16 DIAGNOSIS — D649 Anemia, unspecified: Secondary | ICD-10-CM | POA: Diagnosis not present

## 2024-07-16 DIAGNOSIS — Z0000 Encounter for general adult medical examination without abnormal findings: Secondary | ICD-10-CM | POA: Diagnosis not present

## 2024-07-16 DIAGNOSIS — C73 Malignant neoplasm of thyroid gland: Secondary | ICD-10-CM | POA: Diagnosis not present

## 2024-07-16 DIAGNOSIS — Z1331 Encounter for screening for depression: Secondary | ICD-10-CM | POA: Diagnosis not present

## 2024-07-18 DIAGNOSIS — M25551 Pain in right hip: Secondary | ICD-10-CM | POA: Diagnosis not present

## 2024-07-31 DIAGNOSIS — M25551 Pain in right hip: Secondary | ICD-10-CM | POA: Diagnosis not present

## 2024-08-21 DIAGNOSIS — M25551 Pain in right hip: Secondary | ICD-10-CM | POA: Diagnosis not present

## 2024-09-16 DIAGNOSIS — K080 Exfoliation of teeth due to systemic causes: Secondary | ICD-10-CM | POA: Diagnosis not present

## 2024-10-04 DIAGNOSIS — M25551 Pain in right hip: Secondary | ICD-10-CM | POA: Diagnosis not present

## 2024-10-30 DIAGNOSIS — M25551 Pain in right hip: Secondary | ICD-10-CM | POA: Diagnosis not present

## 2024-11-04 ENCOUNTER — Encounter (HOSPITAL_BASED_OUTPATIENT_CLINIC_OR_DEPARTMENT_OTHER): Payer: Self-pay

## 2024-11-04 DIAGNOSIS — Z1231 Encounter for screening mammogram for malignant neoplasm of breast: Secondary | ICD-10-CM | POA: Diagnosis not present

## 2024-11-19 ENCOUNTER — Ambulatory Visit (HOSPITAL_BASED_OUTPATIENT_CLINIC_OR_DEPARTMENT_OTHER): Payer: Self-pay | Admitting: Obstetrics & Gynecology

## 2025-06-16 ENCOUNTER — Ambulatory Visit (HOSPITAL_BASED_OUTPATIENT_CLINIC_OR_DEPARTMENT_OTHER): Payer: Medicare Other | Admitting: Obstetrics & Gynecology
